# Patient Record
Sex: Female | Born: 1995 | Race: White | Hispanic: No | Marital: Single | Smoking: Never smoker
Health system: Southern US, Community
[De-identification: ages and names within clinical notes are randomized; demographics above are authoritative.]

## PROBLEM LIST (undated history)

## (undated) DIAGNOSIS — F209 Schizophrenia, unspecified: Secondary | ICD-10-CM

## (undated) DIAGNOSIS — F509 Eating disorder, unspecified: Secondary | ICD-10-CM

## (undated) DIAGNOSIS — F329 Major depressive disorder, single episode, unspecified: Secondary | ICD-10-CM

## (undated) DIAGNOSIS — E669 Obesity, unspecified: Secondary | ICD-10-CM

## (undated) DIAGNOSIS — F32A Depression, unspecified: Secondary | ICD-10-CM

## (undated) DIAGNOSIS — J069 Acute upper respiratory infection, unspecified: Secondary | ICD-10-CM

## (undated) DIAGNOSIS — H539 Unspecified visual disturbance: Secondary | ICD-10-CM

## (undated) DIAGNOSIS — L049 Acute lymphadenitis, unspecified: Secondary | ICD-10-CM

## (undated) DIAGNOSIS — Z789 Other specified health status: Secondary | ICD-10-CM

## (undated) DIAGNOSIS — F419 Anxiety disorder, unspecified: Secondary | ICD-10-CM

## (undated) HISTORY — DX: Acute lymphadenitis, unspecified: L04.9

## (undated) HISTORY — DX: Acute upper respiratory infection, unspecified: J06.9

---
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
## 2016-08-10 IMAGING — CT CT ABD-PELV W/ CM
2 of 4 series · 17 of 46 positions shown, 19 images · IV contrast (omnipaque)
Comparison: None.

CLINICAL DATA: Right lower quadrant pain, nausea and vomiting.
History of pancreatitis.

EXAM:
CT ABDOMEN AND PELVIS WITH CONTRAST
TECHNIQUE: Multidetector CT imaging of the abdomen and pelvis was performed
using the standard protocol following bolus administration of
intravenous contrast.
CONTRAST:  100mL OMNIPAQUE IOHEXOL 300 MG/ML  SOLN

[Series 2: routine abd pel with · axial · 0.81mm/px · z∈[-432,-22]mm · 14 of 90 slices shown, 16 images]
[im 4/90  soft-tissue]
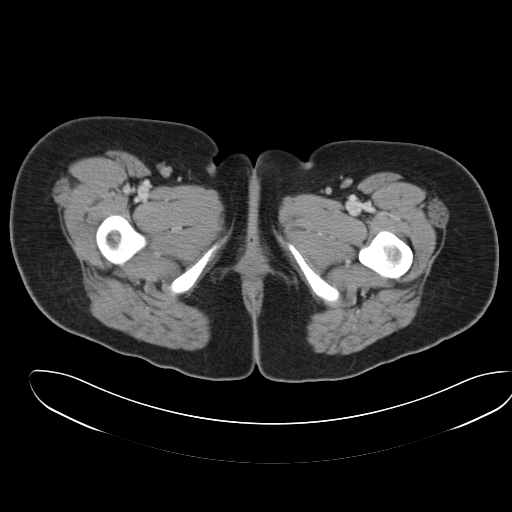
[im 4/90  bone]
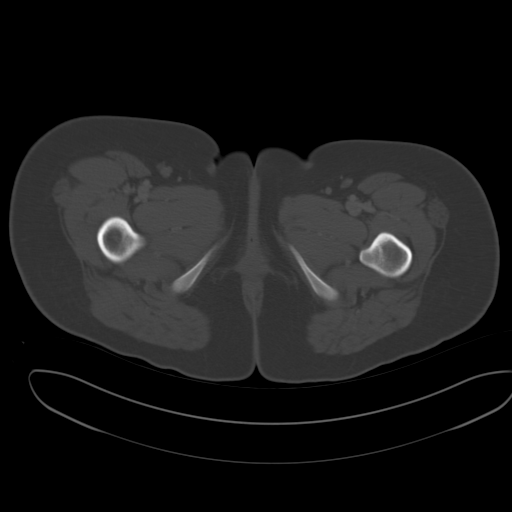
[im 11/90  soft-tissue]
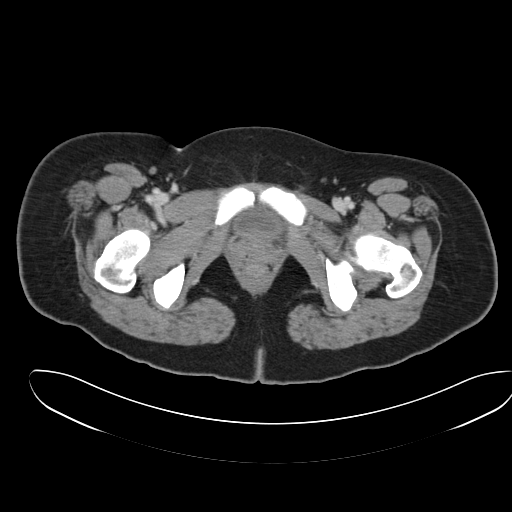
[im 18/90  soft-tissue]
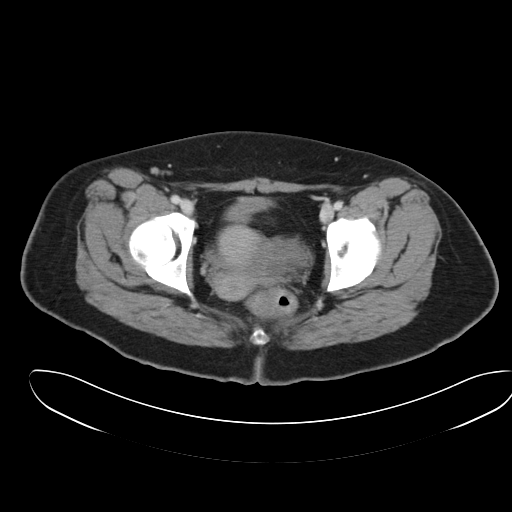
[im 24/90  soft-tissue]
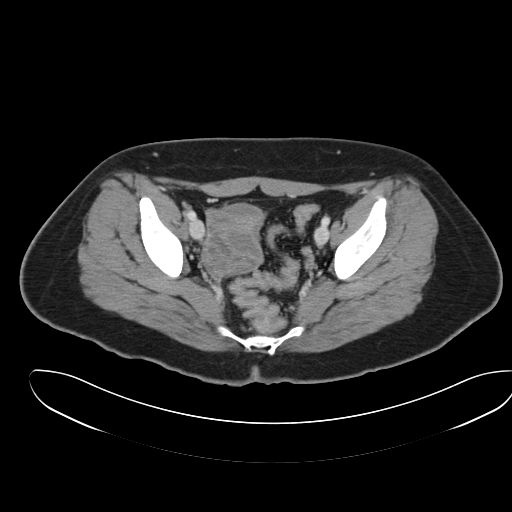
[im 31/90  soft-tissue]
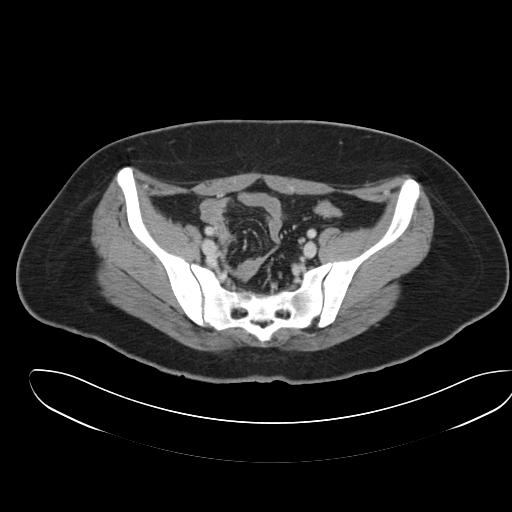
[im 35/90  soft-tissue]
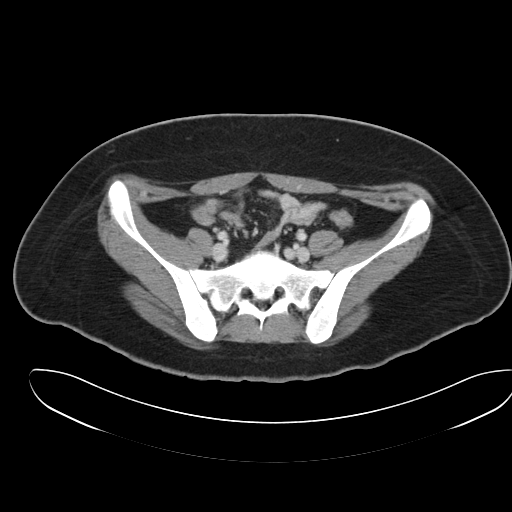
[im 42/90  soft-tissue]
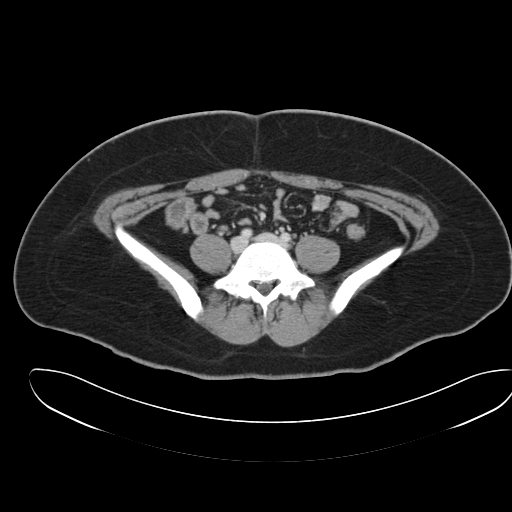
[im 48/90  soft-tissue]
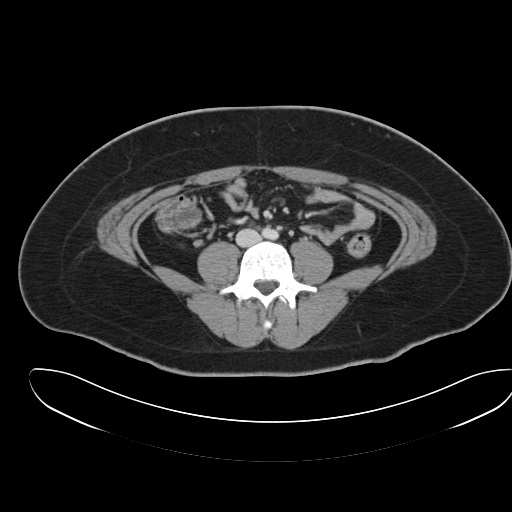
[im 55/90  soft-tissue]
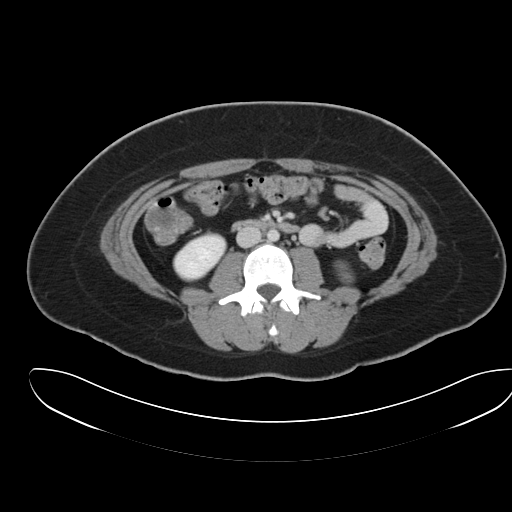
[im 55/90  bone]
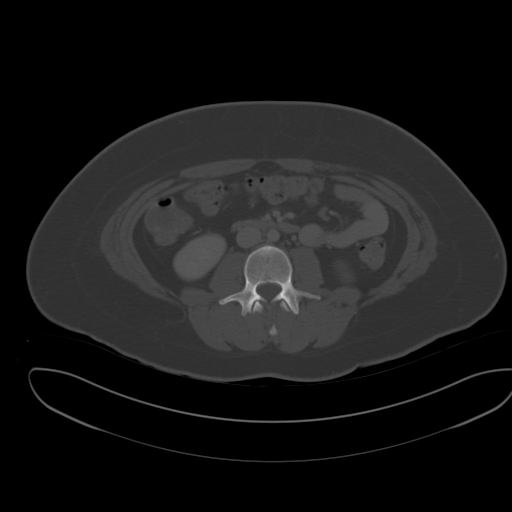
[im 59/90  soft-tissue]
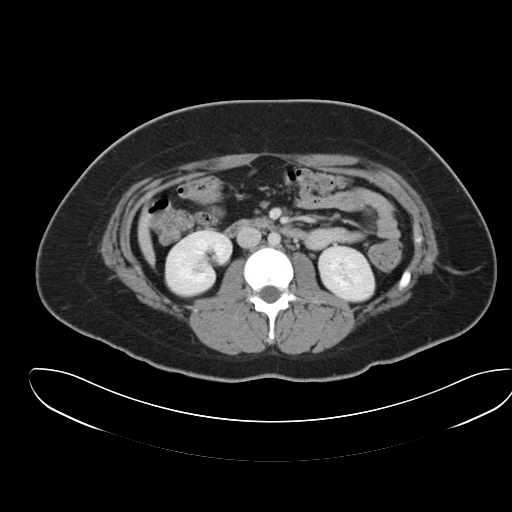
[im 66/90  soft-tissue]
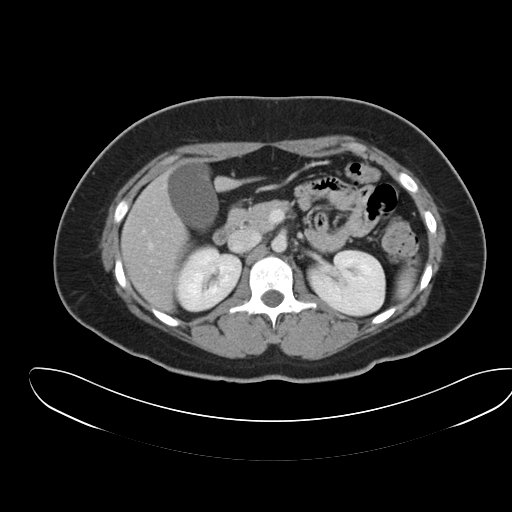
[im 72/90  soft-tissue]
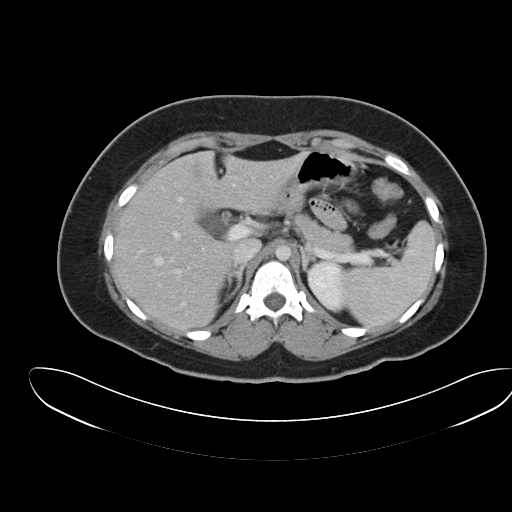
[im 79/90  soft-tissue]
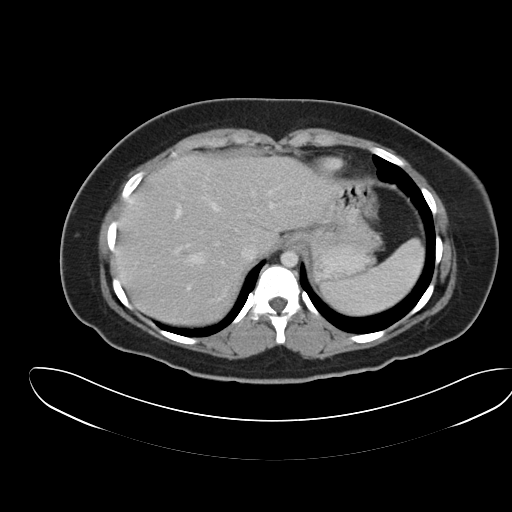
[im 86/90  soft-tissue]
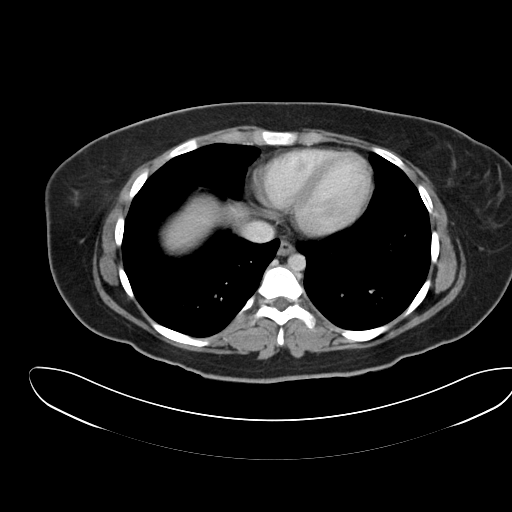

[Series 5: cor routine abd pel with · coronal · 0.92mm/px · 3 of 113 slices shown]
[im 38/113  soft-tissue]
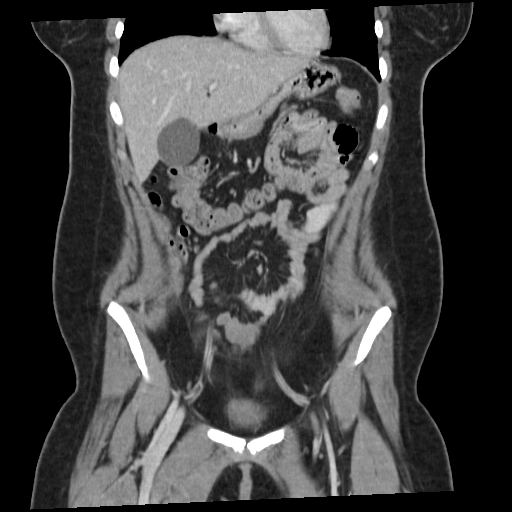
[im 50/113  soft-tissue]
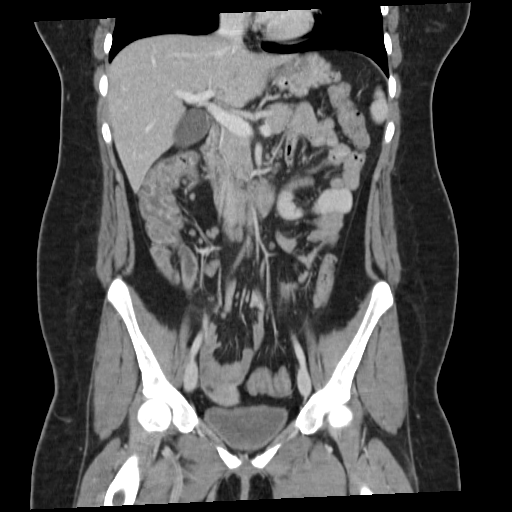
[im 63/113  soft-tissue]
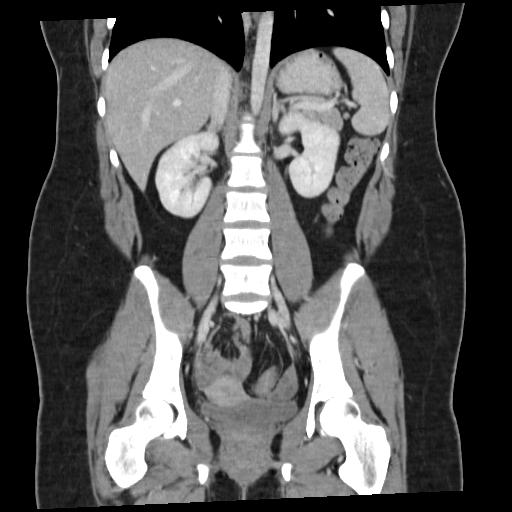

[17 of 46 positions shown; findings below may reference images not displayed]

FINDINGS: Focal fatty infiltration adjacent to the falciform ligament of the
liver. No other focal liver lesions. Gallbladder, spleen, pancreas,
adrenal glands, kidneys, abdominal aorta, inferior vena cava, and
retroperitoneal lymph nodes are unremarkable. Stomach and small
bowel are decompressed. Stool-filled colon without abnormal
distention. No free air or free fluid in the abdomen. Abdominal wall
musculature appears intact.

Pelvis: Scattered lymph nodes throughout the mesentery and right
lower quadrant are moderately prominent without pathologic
enlargement. These are likely reactive. Appendix is normal. Uterus
and ovaries are not enlarged. Bladder wall is not thickened. No free
or loculated pelvic fluid collections. No pelvic mass or
lymphadenopathy. No destructive bone lesions.
IMPRESSION: No acute process demonstrated in the abdomen or pelvis. Focal fatty
infiltration in the liver. Scattered mesenteric lymph nodes are
likely reactive.

## 2017-11-14 ENCOUNTER — Encounter: Payer: Self-pay | Admitting: Emergency Medicine

## 2017-11-14 ENCOUNTER — Emergency Department
Admission: EM | Admit: 2017-11-14 | Discharge: 2017-11-15 | Disposition: A | Discharge status: Home / Self Care | Payer: Medicare Other | Attending: Emergency Medicine | Admitting: Emergency Medicine

## 2017-11-14 ENCOUNTER — Other Ambulatory Visit: Payer: Self-pay

## 2017-11-14 DIAGNOSIS — Z046 Encounter for general psychiatric examination, requested by authority: Secondary | ICD-10-CM | POA: Diagnosis present

## 2017-11-14 DIAGNOSIS — F258 Other schizoaffective disorders: Secondary | ICD-10-CM | POA: Diagnosis not present

## 2017-11-14 DIAGNOSIS — F259 Schizoaffective disorder, unspecified: Secondary | ICD-10-CM

## 2017-11-14 NOTE — ED Notes (Signed)
Hourly rounding reveals patient in hall bed. No complaints, stable, in no acute distress. Q15 minute rounds and monitoring via Rover and Officer to continue.  

## 2017-11-14 NOTE — ED Notes (Signed)
Report to include Situation, Background, Assessment, and Recommendations received from Amy B. RN. Patient alert and oriented, warm and dry, in no acute distress. Patient denies HI, VH and pain. Pt. States she has "passive SI" and hears voices "talking in the background". Patient made aware of Q15 minute rounds and Psychologist, counselling presence for their safety. Patient instructed to come to me with needs or concerns.

## 2017-11-14 NOTE — ED Triage Notes (Signed)
Pt here from [place], states she needs her meds adjusted, has been under a lot of stress. States she isn't feeling the medication has been as effective, hearing more voices recently and seeing things at night, states she feels things are breathing on her at night. Has had an overdose in the past. NAD at this time.

## 2017-11-14 NOTE — ED Notes (Signed)
Pt. Talking to SOC Dr. 

## 2017-11-14 NOTE — ED Notes (Signed)
Patient dressed out by this EDT into purple hospital scrubs. Items in patient belongings bag include sandals, black t-shirt, pink bra, black necklace, black underwear, jeans, medications, wallet, and a pink bag in second belongings bag.

## 2017-11-14 NOTE — ED Provider Notes (Signed)
[facility] Emergency Department Provider Note   ____________________________________________   I have reviewed the triage vital signs and the nursing notes.   HISTORY  Chief Complaint Psychiatric Evaluation and Auditory and visual hallucinations   History limited by: Not cooperative   HPI Kari Lowery is a 22 y.o. female who presents to the emergency department today because of concern for increased auditory hallucinations. She states that they have been getting worse over the past few days. She does have a history of hallucinations. Thinks that her medications need adjusting. States she was recently living in [place] and does not currently have a psychiatrist in this area. Did not reach out to the psychiatrists she had last year when she had multiple admissions to [facility]. She denies any medical complaints.    Per medical record review patient has a history of bipolar, depression, SI.  Past Medical History:  Diagnosis Date  . Anxiety   . Depression   . Eating disorder   . Lymphadenitis, acute   . Medical history non-contributory   . Obesity   . URI, acute   . Vision abnormalities     Patient Active Problem List   Diagnosis Date Noted  . Acute pancreatitis 01/16/2015  . Post traumatic stress disorder 10/03/2013  . Cluster B personality disorder (HCC) 10/03/2013  . Oppositional defiant behavior 10/03/2013  . Suicide attempt by adequate means (HCC) 10/03/2013  . Bipolar I disorder, most recent episode depressed (HCC) 10/02/2013  . PTSD (post-traumatic stress disorder) 07/18/2013  . ODD (oppositional defiant disorder) 07/18/2013  . 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 Generalized abdominal pain 07/04/2013  . Diarrhea   . Vomiting     History reviewed. No pertinent surgical history.  Prior to Admission medications   Medication Sig Start Date End Date Taking? Authorizing Provider   amoxicillin-clavulanate (AUGMENTIN) 875-125 MG per tablet Take 1 tablet by mouth every 12 (twelve) hours. 01/17/15   Alford Highland, MD  omeprazole (PRILOSEC) 20 MG capsule Take 1 capsule (20 mg total) by mouth daily. Patient may resume home supply. 01/17/15   Alford Highland, MD    Allergies Patient has no known allergies.  Family History  Problem Relation Age of Onset  . Bipolar disorder Mother   . Kidney disease Mother   . Cholelithiasis Paternal Grandmother   . Celiac disease Neg Hx     Social History Social History   Tobacco Use  . Smoking status: Never Smoker  . Smokeless tobacco: Never Used  Substance Use Topics  . Alcohol use: No  . Drug use: No    Review of Systems Constitutional: No fever/chills Eyes: No visual changes. ENT: No sore throat. Cardiovascular: Denies chest pain. Respiratory: Denies shortness of breath. Gastrointestinal: No abdominal pain.  No nausea, no vomiting.  No diarrhea.   Genitourinary: Negative for dysuria. Musculoskeletal: Negative for back pain. Skin: Negative for rash. Neurological: Negative for headaches, focal weakness or numbness.  ____________________________________________   PHYSICAL EXAM:  VITAL SIGNS: ED Triage Vitals  Enc Vitals Group     BP 11/14/17 1556 (!) 108/56     Pulse Rate 11/14/17 1556 92     Resp 11/14/17 1556 19     Temp 11/14/17 1556 98.5 F (36.9 C)     Temp Source 11/14/17 1556 Oral     SpO2 11/14/17 1556 99 %     Weight 11/14/17 1539 200 lb (90.7 kg)     Height 11/14/17 1539  (1.626  m)   Constitutional: Alert and oriented. Well appearing and in no distress. Eyes: Conjunctivae are normal.  ENT   Head: Normocephalic and atraumatic.   Nose: No congestion/rhinnorhea.   Mouth/Throat: Mucous membranes are moist.   Neck: No stridor. Hematological/Lymphatic/Immunilogical: No cervical lymphadenopathy. Cardiovascular: Normal rate, regular rhythm.  No murmurs, rubs, or  gallops. Respiratory: Normal respiratory effort without tachypnea nor retractions. Breath sounds are clear and equal bilaterally. No wheezes/rales/rhonchi. Gastrointestinal: Soft and non tender. No rebound. No guarding.  Genitourinary: Deferred Musculoskeletal: Normal range of motion in all extremities. No lower extremity edema. Neurologic:  Normal speech and language. No gross focal neurologic deficits are appreciated.  Skin:  Skin is warm, dry and intact. No rash noted. Psychiatric: Does not appear to be responding to internal stimuli. Does not endorse SI.  ____________________________________________    LABS (pertinent positives/negatives)  Ethan, acet, sali below threshold CMP wnl CBC wbc 12.9, hgb 14.7, plt 367  ____________________________________________   EKG  None  ____________________________________________    RADIOLOGY  None  ____________________________________________   PROCEDURES  Procedures  ____________________________________________   INITIAL IMPRESSION / ASSESSMENT AND PLAN / ED COURSE  Pertinent labs & imaging results that were available during my care of the patient were reviewed by me and considered in my medical decision making (see chart for details).  Patient presented to the emergency department today because of concerns for increased hallucinations.  Patient denies any SI.  Is not obviously responding to any internal stimuli.  Will have psychiatry evaluate.  ____________________________________________   FINAL CLINICAL IMPRESSION(S) / ED DIAGNOSES  Final diagnoses:  Schizoaffective disorder, unspecified type (HCC)     Note: This dictation was prepared with Dragon dictation. Any transcriptional errors that result from this process are unintentional     Phineas Semen, MD 11/15/17 1700

## 2017-11-14 NOTE — ED Notes (Signed)
reintitiated consult for [facility]   2252

## 2017-11-14 NOTE — ED Notes (Signed)
Hourly rounding reveals patient sleeping in hall bed. No complaints, stable, in no acute distress. Q15 minute rounds and monitoring via Rover and Officer to continue.  

## 2017-11-15 LAB — COMPREHENSIVE METABOLIC PANEL
ALT: 27 U/L (ref 14–54)
AST: 24 U/L (ref 15–41)
Albumin: 4.5 g/dL (ref 3.5–5.0)
Alkaline Phosphatase: 92 U/L (ref 38–126)
Anion gap: 9 (ref 5–15)
BUN: 10 mg/dL (ref 6–20)
CHLORIDE: 104 mmol/L (ref 101–111)
CO2: 24 mmol/L (ref 22–32)
CREATININE: 0.62 mg/dL (ref 0.44–1.00)
Calcium: 9.6 mg/dL (ref 8.9–10.3)
GFR calc Af Amer: >60 mL/min (ref >60)
GLUCOSE: 91 mg/dL (ref 65–99)
Potassium: 3.7 mmol/L (ref 3.5–5.1)
Sodium: 137 mmol/L (ref 135–145)
Total Bilirubin: 0.5 mg/dL (ref 0.3–1.2)
Total Protein: 8 g/dL (ref 6.5–8.1)

## 2017-11-15 LAB — CBC WITH DIFFERENTIAL/PLATELET
Basophils Absolute: 0 10*3/uL (ref 0–0.1)
Basophils Relative: 0 %
Eosinophils Absolute: 0.4 10*3/uL (ref 0–0.7)
Eosinophils Relative: 3 %
HCT: 42.2 % (ref 35.0–47.0)
Hemoglobin: 14.7 g/dL (ref 12.0–16.0)
LYMPHS ABS: 3.2 10*3/uL (ref 1.0–3.6)
LYMPHS PCT: 25 %
MCH: 29.1 pg (ref 26.0–34.0)
MCHC: 34.7 g/dL (ref 32.0–36.0)
MCV: 83.9 fL (ref 80.0–100.0)
Monocytes Absolute: 1.1 10*3/uL — ABNORMAL HIGH (ref 0.2–0.9)
Monocytes Relative: 9 %
Neutro Abs: 8.2 10*3/uL — ABNORMAL HIGH (ref 1.4–6.5)
Neutrophils Relative %: 63 %
PLATELETS: 367 10*3/uL (ref 150–440)
RBC: 5.03 MIL/uL (ref 3.80–5.20)
RDW: 13.3 % (ref 11.5–14.5)
WBC: 12.9 10*3/uL — ABNORMAL HIGH (ref 3.6–11.0)

## 2017-11-15 LAB — ACETAMINOPHEN LEVEL: Acetaminophen (Tylenol), Serum: <10 ug/mL — ABNORMAL LOW (ref 10–30)

## 2017-11-15 LAB — SALICYLATE LEVEL: Salicylate Lvl: <7 mg/dL (ref 2.8–30.0)

## 2017-11-15 LAB — ETHANOL: Alcohol, Ethyl (B): <10 mg/dL (ref <10)

## 2017-11-15 MED ORDER — HYDROXYZINE HCL 25 MG PO TABS: 25.0000 mg | ORAL_TABLET | Freq: Three times a day (TID) | ORAL | 0 refills | Status: DC

## 2017-11-15 MED ORDER — TRAZODONE HCL 50 MG PO TABS: 50.0000 mg | ORAL_TABLET | Freq: Every day | ORAL | 0 refills | Status: DC

## 2017-11-15 MED ORDER — ARIPIPRAZOLE 20 MG PO TABS: 20.0000 mg | ORAL_TABLET | Freq: Every day | ORAL | 0 refills | Status: DC

## 2017-11-15 NOTE — Discharge Instructions (Signed)
Take the following medicines daily: Abilify  every morning Vistaril  three times daily Trazodone  at bedtime 2. Return to the ER for worsening symptoms, feelings of hurting yourself or others, or other concerns.

## 2017-11-15 NOTE — ED Notes (Signed)
Hourly rounding reveals patient in hall bed. No complaints, stable, in no acute distress. Q15 minute rounds and monitoring via Rover and Officer to continue.  

## 2017-11-15 NOTE — ED Notes (Signed)
Hourly rounding reveals patient sitting on hall bed. No complaints, stable, in no acute distress. Q15 minute rounds and monitoring via Rover and Officer to continue.  

## 2017-11-15 NOTE — ED Notes (Signed)
Hourly rounding reveals patient sleeping in hall bed. No complaints, stable, in no acute distress. Q15 minute rounds and monitoring via Rover and Officer to continue.  

## 2018-01-12 ENCOUNTER — Emergency Department (HOSPITAL_COMMUNITY)
Admission: EM | Admit: 2018-01-12 | Discharge: 2018-01-13 | Disposition: A | Discharge status: Home / Self Care | Payer: Self-pay | Attending: Emergency Medicine | Admitting: Emergency Medicine

## 2018-01-12 ENCOUNTER — Encounter (HOSPITAL_COMMUNITY): Payer: Self-pay | Admitting: Emergency Medicine

## 2018-01-12 DIAGNOSIS — R443 Hallucinations, unspecified: Secondary | ICD-10-CM

## 2018-01-12 DIAGNOSIS — F209 Schizophrenia, unspecified: Secondary | ICD-10-CM | POA: Insufficient documentation

## 2018-01-12 DIAGNOSIS — R440 Auditory hallucinations: Secondary | ICD-10-CM | POA: Insufficient documentation

## 2018-01-12 DIAGNOSIS — F913 Oppositional defiant disorder: Secondary | ICD-10-CM | POA: Insufficient documentation

## 2018-01-12 DIAGNOSIS — F419 Anxiety disorder, unspecified: Secondary | ICD-10-CM | POA: Insufficient documentation

## 2018-01-12 HISTORY — DX: Schizophrenia, unspecified: F20.9

## 2018-01-12 LAB — CBC WITH DIFFERENTIAL/PLATELET
Abs Immature Granulocytes: 0.1 10*3/uL (ref 0.0–0.1)
BASOS ABS: 0 10*3/uL (ref 0.0–0.1)
Basophils Relative: 0 %
EOS ABS: 0.3 10*3/uL (ref 0.0–0.7)
EOS PCT: 3 %
HCT: 41.1 % (ref 36.0–46.0)
Hemoglobin: 13.6 g/dL (ref 12.0–15.0)
IMMATURE GRANULOCYTES: 1 %
LYMPHS PCT: 23 %
Lymphs Abs: 2.3 10*3/uL (ref 0.7–4.0)
MCH: 27.8 pg (ref 26.0–34.0)
MCHC: 33.1 g/dL (ref 30.0–36.0)
MCV: 84 fL (ref 78.0–100.0)
Monocytes Absolute: 0.8 10*3/uL (ref 0.1–1.0)
Monocytes Relative: 8 %
NEUTROS PCT: 65 %
Neutro Abs: 6.8 10*3/uL (ref 1.7–7.7)
Platelets: 355 10*3/uL (ref 150–400)
RBC: 4.89 MIL/uL (ref 3.87–5.11)
RDW: 12.6 % (ref 11.5–15.5)
WBC: 10.4 10*3/uL (ref 4.0–10.5)

## 2018-01-12 LAB — COMPREHENSIVE METABOLIC PANEL
ALBUMIN: 4 g/dL (ref 3.5–5.0)
ALT: 38 U/L (ref 0–44)
AST: 28 U/L (ref 15–41)
Alkaline Phosphatase: 91 U/L (ref 38–126)
Anion gap: 10 (ref 5–15)
BILIRUBIN TOTAL: 0.4 mg/dL (ref 0.3–1.2)
BUN: 7 mg/dL (ref 6–20)
CO2: 20 mmol/L — ABNORMAL LOW (ref 22–32)
CREATININE: 0.77 mg/dL (ref 0.44–1.00)
Calcium: 9.2 mg/dL (ref 8.9–10.3)
Chloride: 108 mmol/L (ref 98–111)
GFR calc Af Amer: >60 mL/min (ref >60)
GLUCOSE: 99 mg/dL (ref 70–99)
Potassium: 3.5 mmol/L (ref 3.5–5.1)
SODIUM: 138 mmol/L (ref 135–145)
TOTAL PROTEIN: 6.8 g/dL (ref 6.5–8.1)

## 2018-01-12 LAB — RAPID URINE DRUG SCREEN, HOSP PERFORMED
AMPHETAMINES: NOT DETECTED
Benzodiazepines: NOT DETECTED
Cocaine: NOT DETECTED
Opiates: NOT DETECTED
Tetrahydrocannabinol: NOT DETECTED

## 2018-01-12 LAB — ACETAMINOPHEN LEVEL

## 2018-01-12 LAB — SALICYLATE LEVEL: Salicylate Lvl: <7 mg/dL (ref 2.8–30.0)

## 2018-01-12 LAB — ETHANOL

## 2018-01-12 LAB — I-STAT BETA HCG BLOOD, ED (MC, WL, AP ONLY)

## 2018-01-12 MED ORDER — ACETAMINOPHEN 325 MG PO TABS: 650.0000 mg | ORAL_TABLET | Freq: Four times a day (QID) | ORAL | Status: DC | PRN

## 2018-01-12 MED ORDER — LORAZEPAM 1 MG PO TABS
1.0000 mg | ORAL_TABLET | ORAL | Status: DC | PRN
Start: 2018-01-12 — End: 2018-01-13

## 2018-01-12 MED ORDER — HYDROXYZINE HCL 25 MG PO TABS
25.0000 mg | ORAL_TABLET | Freq: Three times a day (TID) | ORAL | Status: DC
  Administered 2018-01-12 – 2018-01-13 (×2): 25 mg via ORAL
  Filled 2018-01-12 (×2): qty 1

## 2018-01-12 MED ORDER — ARIPIPRAZOLE 10 MG PO TABS
20.0000 mg | ORAL_TABLET | Freq: Every day | ORAL | Status: DC
  Administered 2018-01-13: 20 mg via ORAL
  Filled 2018-01-12: qty 2

## 2018-01-12 MED ORDER — TRAZODONE HCL 50 MG PO TABS
50.0000 mg | ORAL_TABLET | Freq: Every day | ORAL | Status: DC
  Administered 2018-01-12: 50 mg via ORAL
  Filled 2018-01-12: qty 1

## 2018-01-12 NOTE — ED Notes (Signed)
Kari Lowery [phone] Pt aunt

## 2018-01-12 NOTE — ED Notes (Signed)
Belongings in locker 4  

## 2018-01-12 NOTE — ED Triage Notes (Signed)
Patient presents to ED for increasing depressed thoughts.  Patient initially voiced no thoughts of hurting herself or others, but when asked if she wished she were dead, she states yes.  Patient states hx of depression, anxiety and schizophrenia and that her aunt encouraged her to come here due to increasing worsening of symptoms.  Patient states she occasionally has visual hallucinations, but has auditory ones most often.  They are "never" command, she states, but also very negative towards her.  Patient states she has not been taking her medications for the past few months due to not having a support system to help her purchase and organize them (states her mom up [place] used to put them in pill boxes for her).  Patient denies drugs or alcohol use.  Patient calm, and cooperative in triage.

## 2018-01-12 NOTE — BH Assessment (Signed)
Unable to complete TTS consult at this time, pt is in the hallway and will be moving to Pod F in about an hour per [place]

## 2018-01-12 NOTE — ED Provider Notes (Addendum)
Patient placed in Quick Look pathway, seen and evaluated   Chief Complaint: Mental health eval  HPI:   22 year old with depression and schizophrenia presents for mental health eval. [place] [facility] however her family brought her here because they didn't like the hospital there. She's been off her meds for 3 months. She doesn't know why she stopped. She has auditory and visual hallucinations but these are all the time. She states that she was in an argument with her family today and therefore she was brought here. No physical complaints. No SI/HI  ROS: +depression, AVH  Physical Exam:   Gen: No distress  Neuro: Awake and Alert  Skin: Warm    Focused Exam: Psych: Calm, cooperative, lucid   Initiation of care has begun. The patient has been counseled on the process, plan, and necessity for staying for the completion/evaluation, and the remainder of the medical screening examination     Bethel BornGekas, Artina Minella Marie, PA-C 01/12/18 1806    Shaune PollackIsaacs, Cameron, MD 01/14/18 1105

## 2018-01-12 NOTE — ED Provider Notes (Signed)
[facility] EMERGENCY DEPARTMENT Provider Note   CSN: [id number] Arrival date & time: 01/12/18  1724     History   Chief Complaint Chief Complaint  Patient presents with  . Depression    HPI Kari Lowery is a 22 y.o. female hx of depression, schizophrenia, here presenting with depression, hallucinations.  Patient states that she decided not to take her medicines several months ago.  Patient was reported to be more aggressive at home per the aunt.  Patient does admits to hearing voices and seeing things.  Patient states that the voices are there but not telling her to do anything.  She states that she is depressed and had previous suicidal attempts but no suicidal ideations currently.  Patient denies any homicidal ideations.  Patient had multiple psychiatric admissions in the past.  The history is provided by the patient.    Past Medical History:  Diagnosis Date  . Anxiety   . Depression   . Eating disorder   . Lymphadenitis, acute   . Medical history non-contributory   . Obesity   . Schizophrenia (HCC)   . URI, acute   . Vision abnormalities     Patient Active Problem List   Diagnosis Date Noted  . Acute pancreatitis 01/16/2015  . Post traumatic stress disorder 10/03/2013  . Cluster B personality disorder (HCC) 10/03/2013  . Oppositional defiant behavior 10/03/2013  . Suicide attempt by adequate means (HCC) 10/03/2013  . Bipolar I disorder, most recent episode depressed (HCC) 10/02/2013  . PTSD (post-traumatic stress disorder) 07/18/2013  . ODD (oppositional defiant disorder) 07/18/2013  . 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 Generalized abdominal pain 07/04/2013  . Diarrhea   . Vomiting     History reviewed. No pertinent surgical history.   OB History   None      Home Medications    Prior to Admission medications   Medication Sig Start Date End Date Taking? Authorizing Provider   ARIPiprazole (ABILIFY) 20 MG tablet Take 1 tablet (20 mg total) by mouth daily. Patient not taking: Reported on 01/12/2018 11/15/17   Irean Hong, MD  hydrOXYzine (ATARAX/VISTARIL) 25 MG tablet Take 1 tablet (25 mg total) by mouth 3 (three) times daily. Patient not taking: Reported on 01/12/2018 11/15/17   Irean Hong, MD  omeprazole (PRILOSEC) 20 MG capsule Take 1 capsule (20 mg total) by mouth daily. Patient may resume home supply. Patient not taking: Reported on 11/14/2017 01/17/15   Alford Highland, MD  traZODone (DESYREL) 50 MG tablet Take 1 tablet (50 mg total) by mouth at bedtime. Patient not taking: Reported on 01/12/2018 11/15/17   Irean Hong, MD    Family History Family History  Problem Relation Age of Onset  . Bipolar disorder Mother   . Kidney disease Mother   . Cholelithiasis Paternal Grandmother   . Celiac disease Neg Hx     Social History Social History   Tobacco Use  . Smoking status: Never Smoker  . Smokeless tobacco: Never Used  Substance Use Topics  . Alcohol use: No  . Drug use: No     Allergies   Patient has no known allergies.   Review of Systems Review of Systems  Psychiatric/Behavioral: Positive for behavioral problems and hallucinations.  All other systems reviewed and are negative.    Physical Exam Updated Vital Signs BP (!) 108/58 (BP Location: Right Arm)   Pulse 79   Temp 98.3 F (36.8 C) (Oral)  Resp 18   SpO2 100%   Physical Exam  Constitutional: She is oriented to person, place, and time. She appears well-developed and well-nourished.  HENT:  Head: Normocephalic.  Mouth/Throat: Oropharynx is clear and moist.  Eyes: Pupils are equal, round, and reactive to light. Conjunctivae and EOM are normal.  Neck: Normal range of motion. Neck supple.  Cardiovascular: Normal rate, regular rhythm and normal heart sounds.  Pulmonary/Chest: Effort normal and breath sounds normal. No stridor. No respiratory distress. She has no wheezes.    Abdominal: Soft. Bowel sounds are normal. She exhibits no distension. There is no tenderness.  Musculoskeletal: Normal range of motion.  Neurological: She is alert and oriented to person, place, and time.  Skin: Skin is warm.  Psychiatric:  Depressed   Nursing note and vitals reviewed.    ED Treatments / Results  Labs (all labs ordered are listed, but only abnormal results are displayed) Labs Reviewed  COMPREHENSIVE METABOLIC PANEL - Abnormal; Notable for the following components:      Result Value   CO2 20 (*)    All other components within normal limits  ACETAMINOPHEN LEVEL - Abnormal; Notable for the following components:   Acetaminophen (Tylenol), Serum <10 (*)    All other components within normal limits  ETHANOL  CBC WITH DIFFERENTIAL/PLATELET  SALICYLATE LEVEL  RAPID URINE DRUG SCREEN, HOSP PERFORMED  I-STAT BETA HCG BLOOD, ED ([facility], [facility], [facility] ONLY)    EKG None  Radiology No results found.  Procedures Procedures (including critical care time)  Medications Ordered in ED Medications - No data to display   Initial Impression / Assessment and Plan / ED Course  I have reviewed the triage vital signs and the nursing notes.  Pertinent labs & imaging results that were available during my care of the patient were reviewed by me and considered in my medical decision making (see chart for details).    Kari Lowery is a 22 y.o. female hx of schizophrenia here with hallucinations, aggressive behavior. She is not taking her meds. Labs unremarkable. Medically cleared for psych eval.    Final Clinical Impressions(s) / ED Diagnoses   Final diagnoses:  None    ED Discharge Orders    None       Charlynne PanderYao, David Hsienta, MD 01/12/18 2235

## 2018-01-12 NOTE — ED Notes (Signed)
No answer x 3  2 triage people and  Nurse first unable to locate the pt

## 2018-01-12 NOTE — ED Notes (Signed)
Per pt aunt who the pt lives with, pt has been off her medication for the past several months and becoming more aggressive at home, aunt reports that she hears and sees voices. Aunt would like to be called if possible to give more insight on pt behavior.

## 2018-01-13 ENCOUNTER — Encounter (HOSPITAL_COMMUNITY): Payer: Self-pay | Admitting: Registered Nurse

## 2018-01-13 MED ORDER — ARIPIPRAZOLE 5 MG PO TABS
5.0000 mg | ORAL_TABLET | Freq: Every day | ORAL | Status: DC
  Administered 2018-01-13: 5 mg via ORAL
  Filled 2018-01-13: qty 1

## 2018-01-13 MED ORDER — ARIPIPRAZOLE ER 400 MG IM SRER
400.0000 mg | INTRAMUSCULAR | Status: DC
  Administered 2018-01-13: 400 mg via INTRAMUSCULAR
  Filled 2018-01-13: qty 2

## 2018-01-13 MED ORDER — ARIPIPRAZOLE 5 MG PO TABS: 5.0000 mg | ORAL_TABLET | Freq: Every day | ORAL | 0 refills | Status: DC

## 2018-01-13 NOTE — ED Notes (Signed)
[facility] and walked out with Grandmother.

## 2018-01-13 NOTE — ED Notes (Signed)
Site at injection at right buttock without. Reddness swelling or drainage and pt denies pain at site.

## 2018-01-13 NOTE — ED Notes (Addendum)
DC intructionsdiscussed with pt and family. Medication regimen confirmed with pharmacy and information about medication and regimen given to pt. Pt home stable with grandmother and denies complaint. Pt understands follow up through [facility] and return for any complaint or problem.

## 2018-01-13 NOTE — ED Notes (Signed)
All belonging were returned

## 2018-01-13 NOTE — ED Notes (Signed)
Regular/no sharps ordered 

## 2018-01-13 NOTE — Discharge Instructions (Addendum)
Please return for any problem. Follow up with your regular physician as instructed.  °

## 2018-01-13 NOTE — Consult Note (Signed)
  Tele Assessment   Kari Lowery, 22 y.o., female [facility] complaints of depression and auditory hallucination; and that she had stopped taking her medication 2-3 months ago.  Patient seen via telepsych by this provider; chart reviewed and consulted with Dr. Lucianne MussKumar on 01/13/18.  On evaluation Kari Lowery reports she was feeling upset and her aunt thought it would be a good idea if she came to the hospital.  Patient states that she was diagnosed with schizophrenia when she was 22 yr old.  States that she has auditory hallucinations on a daily basis.  "They never go away.  The medicine helps some but the voices never stop.  Sometimes they are really bad; and sometimes I can handle them better.  They just say stuff like don't open the door; or don't do this or don't do that; sometimes they laugh at me and make me feel bad for myself.  Right now they are just annoying; but I can handle it."  Patient states that she would prefer to have monthly injection instead of pill because it's easier for her.  States that she has taken Abilify Maintena in past and worked for her.  Patient denies suicidal/self-harm/homicidal ideation and psychosis.  Patient states that she lives with her aunt and was going to [facility] for outpatient psychiatric services but unsure of when last visit was but states she never got her prescription filled.     During evaluation Kari Lowery is alert/oriented x 4; calm/cooperative; and mood congruent with affect.  She does not appear to be responding to internal/external stimuli or delusional thoughts; but reports that she is having auditory hallucinations that are currently annoying.  Patient denied suicidal/self-harm/homicidal ideation, psychosis, and paranoia.  Patient answered question appropriately.  Recommendations:  Give Abilify Maintena 400 mg IM; patient will need to follow up with [facility] in one month for next injection.  Patient will need  prescription for Abilify 5 mg daily PO for 14 days  Disposition:  Patient psychiatrically cleared No evidence of imminent risk to self or others at present.   Patient does not meet criteria for psychiatric inpatient admission.   Spoke with Dr.Messick; informed of above recommendations and disposition  Assunta FoundShuvon Rankin, NP

## 2018-01-13 NOTE — ED Notes (Signed)
Pt received lunch tray 

## 2018-01-13 NOTE — BH Assessment (Addendum)
Tele Assessment Note   Patient Name: Kari Lowery MRN: [id number] Referring Physician: Chaney Malling, MD Location of [facility]: [facility] Location of Provider: Behavioral Health TTS Department  Kari Lowery is an 22 y.o. female who presents voluntarily to [facility] reporting symptoms of depression and auditory/visual hallucinations. Pt has a history of schizophrenia. Pt reports she stopped taking her medications 2 or 3 months ago.  Pt denies current suicidal ideation and denies having a plan. Pt denies past attempts. Pt acknowledges symptoms including: sadness, fatigue, guilt, low self esteem, tearfulness, isolating, lack of motivation, anger, irritability, negative outlook, difficulty concentrating, helplessness, hopelessness, sleeping more, excessive worry and restlessness.  Pt denies homicidal ideation/ history of violence. Pt reports auditory or visual hallucinations or other psychotic symptoms. Pt states current stressors include general family stress.   Pt lives with her aunt and grandmother and supports include her aunt. Pt denies history of abuse and trauma. Pt reports there is a family history of MH/SA. Pt is currently unemployed. Pt has fair insight and partial judgment. Pt's memory is intact.  Pt denies legal history.  Pt's OP history includes receiving medication management at [facility] in [place], [place]. IP history includes an admission to [facility] in September 2018.  Pt denies alcohol/ substance abuse.  Pt is dressed in scrubs, alert, oriented x4 with normal speech and normal motor behavior. Eye contact is good. Pt's mood is depressed and affect is congruent with mood. Thought process is coherent and relevant. There is no indication pt is currently responding to internal stimuli or experiencing delusional thought content. Pt was cooperative throughout assessment. Pt is currently able to contract for safety outside the hospital.   Spoke to pt's aunt Kari Lowery for collateral  information. Per aunt: she came to live with me about 2 or 3 months ago, shortly after moving in she stopped taking her medications.  We noticed a change because she was less social, losing interest in things she enjoys and becoming more agitated and hostile. She has schizophrenia and needs to be on her medications, we talked about all of this today because I want her to get back to herself.  She hears voices even when she is on the medications but they are happier and positive, now the voices are saying mean and hurtful things to her.  She has hallucinated in the past and I don't want things to get to that point."  Pt's aunt also reports the pt attempted suicide a few months ago while she was living in [place] by overdosing on pills.  Diagnosis: F20.9 Schizophrenia  Past Medical History:  Past Medical History:  Diagnosis Date  . Anxiety   . Depression   . Eating disorder   . Lymphadenitis, acute   . Medical history non-contributory   . Obesity   . Schizophrenia (HCC)   . URI, acute   . Vision abnormalities     History reviewed. No pertinent surgical history.  Family History:  Family History  Problem Relation Age of Onset  . Bipolar disorder Mother   . Kidney disease Mother   . Cholelithiasis Paternal Grandmother   . Celiac disease Neg Hx     Social History:  reports that she has never smoked. She has never used smokeless tobacco. She reports that she does not drink alcohol or use drugs.  Additional Social History:  Alcohol / Drug Use Pain Medications: See MAR Prescriptions: See MAR Over the Counter: See MAR History of alcohol / drug use?: No history of alcohol /  drug abuse Longest period of sobriety (when/how long): None   CIWA: CIWA-Ar BP: (!) 108/58 Pulse Rate: 79 COWS:    Allergies: No Known Allergies  Home Medications:  (Not in a hospital admission)  OB/GYN Status:  No LMP recorded. Patient has had an implant.  General Assessment Data Assessment unable to be  completed: Yes Reason for not completing assessment: Pt is in the hallway, should move to Pod F in an hour [facility] Assessment: In system Is this a Tele or Face-to-Face Assessment?: Tele Assessment Is this an Initial Assessment or a Re-assessment for this encounter?: Initial Assessment Marital status: Single Maiden name: NA Is patient pregnant?: No Pregnancy Status: No Living Arrangements: Other relatives(Pt's aunt and grandmother) Can pt return to current living arrangement?: Yes Admission Status: Voluntary Is patient capable of signing voluntary admission?: Yes Referral Source: Self/Family/Friend Insurance type: Out of State Medicaid     Crisis Care Plan Living Arrangements: Other relatives(Pt's aunt and grandmother) Name of Psychiatrist: [facility] Name of Therapist: None  Education Status Is patient currently in school?: No Is the patient employed, unemployed or receiving disability?: Unemployed  Risk to self with the past 6 months Suicidal Ideation: No Has patient been a risk to self within the past 6 months prior to admission? : Yes Suicidal Intent: No Has patient had any suicidal intent within the past 6 months prior to admission? : Yes Is patient at risk for suicide?: No Suicidal Plan?: No Has patient had any suicidal plan within the past 6 months prior to admission? : Yes Access to Means: No What has been your use of drugs/alcohol within the last 12 months?: Pt denies Previous Attempts/Gestures: Yes How many times?: 1 Other Self Harm Risks: Pt denies Triggers for Past Attempts: Unpredictable, Unknown Intentional Self Injurious Behavior: Cutting Comment - Self Injurious Behavior: Pt reports she hasn't cut herself in years Family Suicide History: No Recent stressful life event(s): Turmoil (Comment)(Pt states family stuff but nother major) Persecutory voices/beliefs?: Yes Depression: Yes Depression Symptoms: Despondent, Insomnia,  Tearfulness, Isolating, Fatigue, Guilt, Loss of interest in usual pleasures, Feeling worthless/self pity, Feeling angry/irritable Substance abuse history and/or treatment for substance abuse?: No Suicide prevention information given to non-admitted patients: Not applicable  Risk to Others within the past 6 months Homicidal Ideation: No Does patient have any lifetime risk of violence toward others beyond the six months prior to admission? : No Thoughts of Harm to Others: No Current Homicidal Intent: No Current Homicidal Plan: No Access to Homicidal Means: No Identified Victim: Pt denies History of harm to others?: No Assessment of Violence: None Noted Violent Behavior Description: Pt denies Does patient have access to weapons?: No Criminal Charges Pending?: No Does patient have a court date: No Is patient on probation?: No  Psychosis Hallucinations: Auditory, Visual, With command Delusions: None noted  Mental Status Report Appearance/Hygiene: In scrubs Eye Contact: Good Motor Activity: Freedom of movement Speech: Logical/coherent, Soft Level of Consciousness: Quiet/awake Mood: Depressed Affect: Depressed Anxiety Level: None Thought Processes: Coherent, Relevant Judgement: Partial Orientation: Person, Place, Time, Situation, Appropriate for developmental age Obsessive Compulsive Thoughts/Behaviors: None  Cognitive Functioning Concentration: Normal Memory: Recent Intact, Remote Intact Is patient IDD: No Is patient DD?: No Insight: Fair Impulse Control: Fair Appetite: Good Have you had any weight changes? : No Change Sleep: Decreased Total Hours of Sleep: 9 Vegetative Symptoms: Staying in bed, Not bathing, Decreased grooming  ADLScreening [facility] Patient's cognitive ability adequate to safely complete daily activities?:  Yes Patient able to express need for assistance with ADLs?: Yes Independently performs ADLs?: Yes (appropriate for developmental  age)  Prior Inpatient Therapy Prior Inpatient Therapy: Yes Prior Therapy Dates: 2018 Prior Therapy Facilty/Provider(s): multiple Reason for Treatment: schizophrenia  Prior Outpatient Therapy Prior Outpatient Therapy: Yes Prior Therapy Dates: Past Prior Therapy Facilty/Provider(s): [facility] in [place] Reason for Treatment: Medication management Does patient have an ACCT team?: No Does patient have Intensive In-House Services?  : No Does patient have [facility] services? : No Does patient have P4CC services?: No  ADL Screening (condition at time of admission) Patient's cognitive ability adequate to safely complete daily activities?: Yes Is the patient deaf or have difficulty hearing?: No Does the patient have difficulty seeing, even when wearing glasses/contacts?: No Does the patient have difficulty concentrating, remembering, or making decisions?: No Patient able to express need for assistance with ADLs?: Yes Does the patient have difficulty dressing or bathing?: No Independently performs ADLs?: Yes (appropriate for developmental age) Does the patient have difficulty walking or climbing stairs?: No Weakness of Legs: None Weakness of Arms/Hands: None  Home Assistive Devices/Equipment Home Assistive Devices/Equipment: None    Abuse/Neglect Assessment (Assessment to be complete while patient is alone) Abuse/Neglect Assessment Can Be Completed: Yes Physical Abuse: Denies Verbal Abuse: Denies Sexual Abuse: Denies Exploitation of patient/patient's resources: Denies Self-Neglect: Denies     Merchant navy officer (For Healthcare) Does Patient Have a Medical Advance Directive?: No Would patient like information on creating a medical advance directive?: No - Patient declined          Disposition: Gave clinical report to Nira Conn, NP who recommends overnight observation for safety and stabilization.  Notified Koleen Nimrod, RN of recommendation. Disposition Initial Assessment Completed  for this Encounter: Yes Patient referred to: Other (Comment)(Overnight observation)  This service was provided via telemedicine using a 2-way, interactive audio and video technology.  Names of all persons participating in this telemedicine service and their role in this encounter. Name: Delfino Lovett Role: Patient  Name: Annamaria Boots, MS, [facility] Role: TTS Counselor  Name:  Role:   Name:  Role:     Annamaria Boots, MS, [facility] Therapeutic Triage Specialist

## 2018-02-09 ENCOUNTER — Encounter: Payer: Self-pay | Admitting: Maternal Newborn

## 2018-02-09 ENCOUNTER — Ambulatory Visit (INDEPENDENT_AMBULATORY_CARE_PROVIDER_SITE_OTHER): Payer: Medicare Other | Admitting: Maternal Newborn

## 2018-02-09 VITALS — BP 120/60 | HR 84 | Ht 64.5 in | Wt 202.0 lb

## 2018-02-09 DIAGNOSIS — Z3046 Encounter for surveillance of implantable subdermal contraceptive: Secondary | ICD-10-CM

## 2018-02-09 DIAGNOSIS — Z3049 Encounter for surveillance of other contraceptives: Secondary | ICD-10-CM | POA: Diagnosis not present

## 2018-02-09 NOTE — Progress Notes (Signed)
  GYNECOLOGY PROCEDURE NOTE  Nexplanon removal discussed in detail.  Risks of infection, bleeding, nerve injury reviewed.  Patient understands risks and desires to proceed.  Verbal consent obtained.  Patient is certain she wants the Nexplanon removed.  All questions answered.  Procedure: Patient placed in dorsal supine with left arm above head, elbow flexed at 90 degrees, arm resting on examination table.  Nexplanon identified without problems.  Betadine scrub x3.  1 ml of 1% lidocaine injected under device. Sterile gloves applied.  Small 0.5 cm incision made at distal tip of device with 11 blade scalpel.  Nexplanon brought to incision and grasped with a small Kelly clamp.  Nexplanon removed intact.  Hemostasis obtained.  Steri-strips applied, followed by bandage and compression dressing.  Patient tolerated the procedure well.    Assessment: 22 y.o. year old female now s/p Nexplanon removal.  Plan: 1.  Patient given post procedure precautions and asked to call for fever, redness or drainage from her incision, bleeding from incision.  She understands she will likely have bruising near the site of removal and can remove bandage tomorrow and steri-strips in approximately 5 days  2. Contraception: considering pills or patch. Would like cycle to get back to normal. Not currently sexually active. Asked her to return for appointment when she has decided on a contraceptive method.  Marcelyn BruinsJacelyn Schmid, CNM 02/09/2018

## 2018-06-09 ENCOUNTER — Emergency Department
Admission: EM | Admit: 2018-06-09 | Discharge: 2018-06-10 | Disposition: A | Discharge status: Home / Self Care | Payer: Medicare Other | Attending: Emergency Medicine | Admitting: Emergency Medicine

## 2018-06-09 ENCOUNTER — Other Ambulatory Visit: Payer: Self-pay

## 2018-06-09 DIAGNOSIS — Z79899 Other long term (current) drug therapy: Secondary | ICD-10-CM | POA: Insufficient documentation

## 2018-06-09 DIAGNOSIS — E876 Hypokalemia: Secondary | ICD-10-CM | POA: Insufficient documentation

## 2018-06-09 DIAGNOSIS — R404 Transient alteration of awareness: Secondary | ICD-10-CM | POA: Insufficient documentation

## 2018-06-09 DIAGNOSIS — R4182 Altered mental status, unspecified: Secondary | ICD-10-CM | POA: Diagnosis present

## 2018-06-09 NOTE — ED Notes (Signed)
Pt's mom Jazmin called from [place] and informed us that pt has hx of schizo and multiple OD attempts. Mom also states that pt told girlfriend that she did weed and took some pills. Unsure of pills or how many.   Call mom with updates or give number to pt so she can call. [phone]  Pt denies any pills at this time.

## 2018-06-09 NOTE — ED Notes (Signed)
Report given to Cole RN 

## 2018-06-09 NOTE — ED Triage Notes (Signed)
Pt comes from home via [facility] from home with c/o possible LOC and screaming and yelling for no apparent reason.  Pt arrives awake but not answering any questions at this time.  EMS report that patient would scream when touched and that friends stated she went unconscious at one point.  Pt admitted to smoking weed per EMS but denies any other substances.  Pupils dilated about a 4.

## 2018-06-10 DIAGNOSIS — R404 Transient alteration of awareness: Secondary | ICD-10-CM | POA: Diagnosis not present

## 2018-06-10 LAB — URINE DRUG SCREEN, QUALITATIVE (ARMC ONLY)
AMPHETAMINES, UR SCREEN: NOT DETECTED
Barbiturates, Ur Screen: NOT DETECTED
Benzodiazepine, Ur Scrn: NOT DETECTED
Cannabinoid 50 Ng, Ur ~~LOC~~: NOT DETECTED
Cocaine Metabolite,Ur ~~LOC~~: NOT DETECTED
MDMA (Ecstasy)Ur Screen: NOT DETECTED
METHADONE SCREEN, URINE: NOT DETECTED
OPIATE, UR SCREEN: NOT DETECTED
PHENCYCLIDINE (PCP) UR S: NOT DETECTED
Tricyclic, Ur Screen: NOT DETECTED

## 2018-06-10 LAB — URINALYSIS, COMPLETE (UACMP) WITH MICROSCOPIC
Bilirubin Urine: NEGATIVE
Glucose, UA: NEGATIVE mg/dL
Ketones, ur: 20 mg/dL — AB
Leukocytes, UA: NEGATIVE
Nitrite: NEGATIVE
PH: 6 (ref 5.0–8.0)
Protein, ur: NEGATIVE mg/dL
Specific Gravity, Urine: 1.005 (ref 1.005–1.030)

## 2018-06-10 LAB — CBC WITH DIFFERENTIAL/PLATELET
Abs Immature Granulocytes: 0.09 10*3/uL — ABNORMAL HIGH (ref 0.00–0.07)
Basophils Absolute: 0 10*3/uL (ref 0.0–0.1)
Basophils Relative: 0 %
Eosinophils Absolute: 0.1 10*3/uL (ref 0.0–0.5)
Eosinophils Relative: 1 %
HCT: 44.2 % (ref 36.0–46.0)
Hemoglobin: 14.9 g/dL (ref 12.0–15.0)
Immature Granulocytes: 1 %
Lymphocytes Relative: 12 %
Lymphs Abs: 1.8 10*3/uL (ref 0.7–4.0)
MCH: 28.5 pg (ref 26.0–34.0)
MCHC: 33.7 g/dL (ref 30.0–36.0)
MCV: 84.5 fL (ref 80.0–100.0)
MONO ABS: 1.2 10*3/uL — AB (ref 0.1–1.0)
Monocytes Relative: 8 %
Neutro Abs: 11.7 10*3/uL — ABNORMAL HIGH (ref 1.7–7.7)
Neutrophils Relative %: 78 %
Platelets: 359 10*3/uL (ref 150–400)
RBC: 5.23 MIL/uL — ABNORMAL HIGH (ref 3.87–5.11)
RDW: 12.3 % (ref 11.5–15.5)
WBC: 14.8 10*3/uL — ABNORMAL HIGH (ref 4.0–10.5)
nRBC: 0 % (ref 0.0–0.2)

## 2018-06-10 LAB — COMPREHENSIVE METABOLIC PANEL
ALK PHOS: 113 U/L (ref 38–126)
ALT: 54 U/L — ABNORMAL HIGH (ref 0–44)
ANION GAP: 10 (ref 5–15)
AST: 48 U/L — ABNORMAL HIGH (ref 15–41)
Albumin: 5 g/dL (ref 3.5–5.0)
BILIRUBIN TOTAL: 0.4 mg/dL (ref 0.3–1.2)
BUN: 9 mg/dL (ref 6–20)
CALCIUM: 9.7 mg/dL (ref 8.9–10.3)
CO2: 22 mmol/L (ref 22–32)
Chloride: 108 mmol/L (ref 98–111)
Creatinine, Ser: 0.7 mg/dL (ref 0.44–1.00)
Glucose, Bld: 95 mg/dL (ref 70–99)
POTASSIUM: 3.2 mmol/L — AB (ref 3.5–5.1)
Sodium: 140 mmol/L (ref 135–145)
TOTAL PROTEIN: 8.3 g/dL — AB (ref 6.5–8.1)

## 2018-06-10 LAB — POCT PREGNANCY, URINE: Preg Test, Ur: NEGATIVE

## 2018-06-10 LAB — ETHANOL

## 2018-06-10 LAB — LIPASE, BLOOD: LIPASE: 28 U/L (ref 11–51)

## 2018-06-10 LAB — SALICYLATE LEVEL: Salicylate Lvl: <7 mg/dL (ref 2.8–30.0)

## 2018-06-10 LAB — ACETAMINOPHEN LEVEL: Acetaminophen (Tylenol), Serum: <10 ug/mL — ABNORMAL LOW (ref 10–30)

## 2018-06-10 MED ORDER — POTASSIUM CHLORIDE CRYS ER 20 MEQ PO TBCR
40.0000 meq | EXTENDED_RELEASE_TABLET | Freq: Once | ORAL | Status: AC
  Administered 2018-06-10: 40 meq via ORAL
  Filled 2018-06-10: qty 2

## 2018-06-10 NOTE — ED Provider Notes (Signed)
[facility] Emergency Department Provider Note  ____________________________________________   First MD Initiated Contact with Patient 06/09/18 2330     (approximate)  I have reviewed the triage vital signs and the nursing notes.   HISTORY  Chief Complaint Altered Mental Status and Loss of Consciousness    HPI Kari Lowery is a 22 y.o. female whose medical history notably includes schizophrenia and a cluster B personality disorder as well as other medical and psychiatric issues listed below.  She presents by EMS for transient altered mental status.  Her mother and girlfriend are now present at bedside and the patient gives permission for them to be present and included in the history.  The patient was reportedly with her girlfriend and they were smoking marijuana when she began to "freak out", not make sense, get very anxious and upset, and then passed out.  She was not responding to them so the girlfriend called 911.  After EMS arrived she seemed confused and when she initially arrived to the emergency department she was not talking with anyone although she was awake and alert, but now she seems to be completely back at her baseline.  She says that she does not remember what happened.  She did not lose bowel or bladder control and has not sustained any injuries.  She denies pain and specifically denies headache, neck pain, shortness of breath, chest pain, nausea, vomiting, abdominal pain, and dysuria.  She did not take any other drugs except for the marijuana and the girlfriend reports that this was a new batch that they had not smoked previously.  Patient denies alcohol use.  She also adamantly denies suicidal ideation and homicidal ideation.  Symptoms were reportedly acute in onset and severe and nothing in particular made it better or worse but she is now back at her baseline.  Past Medical History:  Diagnosis Date  . Anxiety   . Depression   . Eating  disorder   . Lymphadenitis, acute   . Medical history non-contributory   . Obesity   . Schizophrenia (HCC)   . URI, acute   . Vision abnormalities     Patient Active Problem List   Diagnosis Date Noted  . Acute pancreatitis 01/16/2015  . Post traumatic stress disorder 10/03/2013  . Cluster B personality disorder (HCC) 10/03/2013  . Oppositional defiant behavior 10/03/2013  . Suicide attempt by adequate means (HCC) 10/03/2013  . Bipolar I disorder, most recent episode depressed (HCC) 10/02/2013  . PTSD (post-traumatic stress disorder) 07/18/2013  . ODD (oppositional defiant disorder) 07/18/2013  . 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 Generalized abdominal pain 07/04/2013  . Diarrhea   . Vomiting     History reviewed. No pertinent surgical history.  Prior to Admission medications   Medication Sig Start Date End Date Taking? Authorizing Provider  ARIPiprazole (ABILIFY) 20 MG tablet Take 1 tablet (20 mg total) by mouth daily. Patient not taking: Reported on 02/09/2018 11/15/17   Irean Hong, MD  ARIPiprazole (ABILIFY) 5 MG tablet Take 1 tablet (5 mg total) by mouth daily. Patient not taking: Reported on 02/09/2018 01/13/18   Wynetta Fines, MD  ARIPiprazole ER (ABILIFY MAINTENA) 400 MG PRSY prefilled syringe Inject 400 mg into the muscle every 28 (twenty-eight) days.    [provider]  hydrOXYzine (ATARAX/VISTARIL) 25 MG tablet Take 1 tablet (25 mg total) by mouth 3 (three) times daily. Patient not taking: Reported on 01/12/2018 11/15/17  Irean HongSung, Jade J, MD  omeprazole (PRILOSEC) 20 MG capsule Take 1 capsule (20 mg total) by mouth daily. Patient may resume home supply. Patient not taking: Reported on 11/14/2017 01/17/15   Alford HighlandWieting, Richard, MD  traZODone (DESYREL) 50 MG tablet Take 1 tablet (50 mg total) by mouth at bedtime. 11/15/17   Irean HongSung, Jade J, MD    Allergies Patient has no known allergies.  Family History  Problem  Relation Age of Onset  . Bipolar disorder Mother   . Kidney disease Mother   . Cholelithiasis Paternal Grandmother   . Celiac disease Neg Hx     Social History Social History   Tobacco Use  . Smoking status: Never Smoker  . Smokeless tobacco: Never Used  Substance Use Topics  . Alcohol use: No  . Drug use: No    Review of Systems Constitutional: No fever/chills Eyes: No visual changes. ENT: No sore throat. Cardiovascular: Denies chest pain.  Possible syncope. Respiratory: Denies shortness of breath. Gastrointestinal: No abdominal pain.  No nausea, no vomiting.  No diarrhea.  No constipation. Genitourinary: Negative for dysuria. Musculoskeletal: Negative for neck pain.  Negative for back pain. Integumentary: Negative for rash. Neurological: Negative for headaches, focal weakness or numbness. Psychiatric:"Freak out" with screaming and altered mental status and possible syncope, now at baseline.  ____________________________________________   PHYSICAL EXAM:  VITAL SIGNS: ED Triage Vitals  Enc Vitals Group     BP 06/09/18 2258 (!) 152/97     Pulse Rate 06/09/18 2258 (!) 108     Resp 06/09/18 2258 18     Temp 06/09/18 2258 98.7 F (37.1 C)     Temp Source 06/09/18 2258 Oral     SpO2 06/09/18 2258 99 %     Weight 06/09/18 2307 90.7 kg (200 lb)     Height 06/09/18 2307 1.651 m (5\' 5" )     Head Circumference --      Peak Flow --      Pain Score 06/09/18 2307 0     Pain Loc --      Pain Edu? --      Excl. in GC? --     Constitutional: Alert and oriented. Well appearing and in no acute distress. Eyes: Conjunctivae are normal.  Head: Atraumatic. Nose: No congestion/rhinnorhea. Mouth/Throat: Mucous membranes are moist. Neck: No stridor.  No meningeal signs.   Cardiovascular: Normal rate, regular rhythm. Good peripheral circulation. Grossly normal heart sounds. Respiratory: Normal respiratory effort.  No retractions. Lungs CTAB. Gastrointestinal: Soft and  nontender. No distention.  Musculoskeletal: No lower extremity tenderness nor edema. No gross deformities of extremities. Neurologic:  Normal speech and language. No gross focal neurologic deficits are appreciated.  Skin:  Skin is warm, dry and intact. No rash noted. Psychiatric: Mood and affect are normal. Speech and behavior are normal.  Calm and cooperative, denies SI/HI. ____________________________________________   LABS (all labs ordered are listed, but only abnormal results are displayed)  Labs Reviewed  ACETAMINOPHEN LEVEL - Abnormal; Notable for the following components:      Result Value   Acetaminophen (Tylenol), Serum <10 (*)    All other components within normal limits  COMPREHENSIVE METABOLIC PANEL - Abnormal; Notable for the following components:   Potassium 3.2 (*)    Total Protein 8.3 (*)    AST 48 (*)    ALT 54 (*)    All other components within normal limits  CBC WITH DIFFERENTIAL/PLATELET - Abnormal; Notable for the following components:   WBC 14.8 (*)  RBC 5.23 (*)    Neutro Abs 11.7 (*)    Monocytes Absolute 1.2 (*)    Abs Immature Granulocytes 0.09 (*)    All other components within normal limits  URINALYSIS, COMPLETE (UACMP) WITH MICROSCOPIC - Abnormal; Notable for the following components:   Color, Urine YELLOW (*)    APPearance HAZY (*)    Hgb urine dipstick MODERATE (*)    Ketones, ur 20 (*)    Bacteria, UA RARE (*)    All other components within normal limits  LIPASE, BLOOD  ETHANOL  URINE DRUG SCREEN, QUALITATIVE ([facility] ONLY)  SALICYLATE LEVEL  POC URINE PREG, ED  POCT PREGNANCY, URINE   ____________________________________________  EKG  No indication for EKG ____________________________________________  RADIOLOGY   ED MD interpretation: No indication for imaging  Official radiology report(s): No results found.  ____________________________________________   PROCEDURES  Critical Care performed: No   Procedure(s) performed:    Procedures   ____________________________________________   INITIAL IMPRESSION / ASSESSMENT AND PLAN / ED COURSE  As part of my medical decision making, I reviewed the following data within the electronic MEDICAL RECORD NUMBER History obtained from family, Nursing notes reviewed and incorporated, Labs reviewed , Old chart reviewed and Notes from prior ED visits    Differential diagnosis includes, but is not limited to, medication/drug side effect, acute psychiatric decompensation, acute infectious process, seizure, metabolic abnormality.  I think that she had a side effect to the marijuana she was smoking.  Interestingly, her urine drug screen is negative, but reportedly she does not smoke regularly and I do not know how quickly the urine drug screen becomes positive after smoking substances.  It is also possible that something other than just marijuana was present in what they were smoking.  The patient's lab work is otherwise unremarkable and reassuring.  There is no evidence that she had a seizure and she is back at her baseline.  I will observe her for a period of time and then anticipate discharge home with outpatient follow-up.  The patient may eat and drink in the emergency department.  The patient, mother, and girlfriend are all comfortable with the plan.  Clinical Course as of Jun 11 615  Fri Jun 10, 2018  0202 Lab work is generally reassuring.  The potassium is slightly low at 3.2 and I am giving 40 mEq by mouth for replacement.  No evidence of UTI, labs otherwise unremarkable.  Her LFTs are very slightly elevated but not clinically significant and she has no tenderness to palpation of the abdomen.  I encouraged outpatient follow-up, drinking plenty of fluids, and avoiding any more illicit substances.  I gave my usual and customary return precautions.   [CF]    Clinical Course User Index [CF] Loleta Rose, MD    ____________________________________________  FINAL CLINICAL  IMPRESSION(S) / ED DIAGNOSES  Final diagnoses:  Transient alteration of awareness  Hypokalemia     MEDICATIONS GIVEN DURING THIS VISIT:  Medications  potassium chloride SA (K-DUR,KLOR-CON) CR tablet 40 mEq (40 mEq Oral Given 06/10/18 0206)     ED Discharge Orders    None       Note:  This document was prepared using Dragon voice recognition software and may include unintentional dictation errors.    Loleta Rose, MD 06/10/18 [phone]

## 2018-06-10 NOTE — ED Notes (Signed)
Pt able to now talk in complete sentences and is asking about what happened. Pt informed that she was screaming and not making any sense where she was living. Pt now AlertxOx4.

## 2018-06-10 NOTE — Discharge Instructions (Signed)
Your workup in the Emergency Department today was reassuring.  We did not find any specific abnormalities.  We recommend you drink plenty of fluids, take your regular medications and/or any new ones prescribed today, and follow up with the doctor(s) listed in these documents as recommended.  Return to the Emergency Department if you develop new or worsening symptoms that concern you.  

## 2018-06-15 ENCOUNTER — Emergency Department
Admission: EM | Admit: 2018-06-15 | Discharge: 2018-06-16 | Disposition: A | Discharge status: Home / Self Care | Payer: Medicare Other | Attending: Emergency Medicine | Admitting: Emergency Medicine

## 2018-06-15 ENCOUNTER — Other Ambulatory Visit: Payer: Self-pay

## 2018-06-15 DIAGNOSIS — R440 Auditory hallucinations: Secondary | ICD-10-CM | POA: Diagnosis present

## 2018-06-15 DIAGNOSIS — Z79899 Other long term (current) drug therapy: Secondary | ICD-10-CM | POA: Insufficient documentation

## 2018-06-15 DIAGNOSIS — F259 Schizoaffective disorder, unspecified: Secondary | ICD-10-CM | POA: Diagnosis not present

## 2018-06-15 LAB — CBC
HCT: 42 % (ref 36.0–46.0)
Hemoglobin: 14.1 g/dL (ref 12.0–15.0)
MCH: 28.5 pg (ref 26.0–34.0)
MCHC: 33.6 g/dL (ref 30.0–36.0)
MCV: 84.8 fL (ref 80.0–100.0)
Platelets: 344 10*3/uL (ref 150–400)
RBC: 4.95 MIL/uL (ref 3.87–5.11)
RDW: 12.5 % (ref 11.5–15.5)
WBC: 10.6 10*3/uL — ABNORMAL HIGH (ref 4.0–10.5)
nRBC: 0 % (ref 0.0–0.2)

## 2018-06-15 LAB — COMPREHENSIVE METABOLIC PANEL
ALT: 50 U/L — AB (ref 0–44)
AST: 33 U/L (ref 15–41)
Albumin: 4.4 g/dL (ref 3.5–5.0)
Alkaline Phosphatase: 102 U/L (ref 38–126)
Anion gap: 8 (ref 5–15)
BUN: 7 mg/dL (ref 6–20)
CO2: 25 mmol/L (ref 22–32)
Calcium: 9.3 mg/dL (ref 8.9–10.3)
Chloride: 105 mmol/L (ref 98–111)
Creatinine, Ser: 0.75 mg/dL (ref 0.44–1.00)
GFR calc non Af Amer: >60 mL/min (ref >60)
Glucose, Bld: 111 mg/dL — ABNORMAL HIGH (ref 70–99)
Potassium: 3.5 mmol/L (ref 3.5–5.1)
Sodium: 138 mmol/L (ref 135–145)
Total Bilirubin: 0.4 mg/dL (ref 0.3–1.2)
Total Protein: 7.5 g/dL (ref 6.5–8.1)

## 2018-06-15 LAB — ETHANOL: ALCOHOL ETHYL (B): 12 mg/dL — AB (ref <10)

## 2018-06-15 NOTE — ED Notes (Signed)
Brown boots, socks, long sleeved shirt, fussy jacket, jeans, black backpack, bra, panties, and piercings removed and locked in secured unit. Also cell phone locked with belongings.

## 2018-06-15 NOTE — ED Provider Notes (Signed)
[facility] Emergency Department Provider Note   ____________________________________________   First MD Initiated Contact with Patient 06/15/18 2338     (approximate)  I have reviewed the triage vital signs and the nursing notes.   HISTORY  Chief Complaint Psychiatric Evaluation    HPI Kari Lowery is a 22 y.o. female who presents to the ED from home with a chief complaint of auditory hallucinations.  Reports history of schizophrenia and states when she gets stressed she has worsening symptoms.  States she has been stressed out recently.  Hurting voices to harm herself but states she would not do it.  Voices no medical complaints.   Past Medical History:  Diagnosis Date  . Anxiety   . Depression   . Eating disorder   . Lymphadenitis, acute   . Medical history non-contributory   . Obesity   . Schizophrenia (HCC)   . URI, acute   . Vision abnormalities     Patient Active Problem List   Diagnosis Date Noted  . Acute pancreatitis 01/16/2015  . Post traumatic stress disorder 10/03/2013  . Cluster B personality disorder (HCC) 10/03/2013  . Oppositional defiant behavior 10/03/2013  . Suicide attempt by adequate means (HCC) 10/03/2013  . Bipolar I disorder, most recent episode depressed (HCC) 10/02/2013  . PTSD (post-traumatic stress disorder) 07/18/2013  . ODD (oppositional defiant disorder) 07/18/2013  . 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 Generalized abdominal pain 07/04/2013  . Diarrhea   . Vomiting     No past surgical history on file.  Prior to Admission medications   Medication Sig Start Date End Date Taking? Authorizing Provider  ARIPiprazole (ABILIFY) 20 MG tablet Take 1 tablet (20 mg total) by mouth daily. Patient not taking: Reported on 02/09/2018 11/15/17   Irean Hong, MD  ARIPiprazole (ABILIFY) 5 MG tablet Take 1 tablet (5 mg total) by mouth daily. Patient not taking:  Reported on 02/09/2018 01/13/18   Wynetta Fines, MD  ARIPiprazole ER (ABILIFY MAINTENA) 400 MG PRSY prefilled syringe Inject 400 mg into the muscle every 28 (twenty-eight) days.    [provider]  hydrOXYzine (ATARAX/VISTARIL) 25 MG tablet Take 1 tablet (25 mg total) by mouth 3 (three) times daily. Patient not taking: Reported on 01/12/2018 11/15/17   Irean Hong, MD  omeprazole (PRILOSEC) 20 MG capsule Take 1 capsule (20 mg total) by mouth daily. Patient may resume home supply. Patient not taking: Reported on 11/14/2017 01/17/15   Alford Highland, MD  traZODone (DESYREL) 50 MG tablet Take 1 tablet (50 mg total) by mouth at bedtime. 11/15/17   Irean Hong, MD    Allergies Patient has no known allergies.  Family History  Problem Relation Age of Onset  . Bipolar disorder Mother   . Kidney disease Mother   . Cholelithiasis Paternal Grandmother   . Celiac disease Neg Hx     Social History Social History   Tobacco Use  . Smoking status: Never Smoker  . Smokeless tobacco: Never Used  Substance Use Topics  . Alcohol use: No  . Drug use: No    Review of Systems  Constitutional: No fever/chills Eyes: No visual changes. ENT: No sore throat. Cardiovascular: Denies chest pain. Respiratory: Denies shortness of breath. Gastrointestinal: No abdominal pain.  No nausea, no vomiting.  No diarrhea.  No constipation. Genitourinary: Negative for dysuria. Musculoskeletal: Negative for back pain. Skin: Negative for rash. Neurological: Negative for headaches, focal weakness or numbness.  Psychiatric:Positive for auditory hallucinations.  ____________________________________________   PHYSICAL EXAM:  VITAL SIGNS: ED Triage Vitals [06/15/18 2316]  Enc Vitals Group     BP 108/71     Pulse Rate 86     Resp 20     Temp 98.7 F (37.1 C)     Temp Source Oral     SpO2 100 %     Weight 190 lb (86.2 kg)     Height 5\' 4"  (1.626 m)     Head Circumference      Peak Flow      Pain  Score 0     Pain Loc      Pain Edu?      Excl. in GC?     Constitutional: Alert and oriented. Well appearing and in no acute distress. Eyes: Conjunctivae are normal. PERRL. EOMI. Head: Atraumatic. Nose: No congestion/rhinnorhea. Mouth/Throat: Mucous membranes are moist.  Oropharynx non-erythematous. Neck: No stridor.   Cardiovascular: Normal rate, regular rhythm. Grossly normal heart sounds.  Good peripheral circulation. Respiratory: Normal respiratory effort.  No retractions. Lungs CTAB. Gastrointestinal: Soft and nontender. No distention. No abdominal bruits. No CVA tenderness. Musculoskeletal: No lower extremity tenderness nor edema.  No joint effusions. Neurologic:  Normal speech and language. No gross focal neurologic deficits are appreciated. No gait instability. Skin:  Skin is warm, dry and intact. No rash noted. Psychiatric: Mood and affect are flat.  Speech and behavior are normal.  ____________________________________________   LABS (all labs ordered are listed, but only abnormal results are displayed)  Labs Reviewed  CBC - Abnormal; Notable for the following components:      Result Value   WBC 10.6 (*)    All other components within normal limits  COMPREHENSIVE METABOLIC PANEL - Abnormal; Notable for the following components:   Glucose, Bld 111 (*)    ALT 50 (*)    All other components within normal limits  ETHANOL - Abnormal; Notable for the following components:   Alcohol, Ethyl (B) 12 (*)    All other components within normal limits  ACETAMINOPHEN LEVEL - Abnormal; Notable for the following components:   Acetaminophen (Tylenol), Serum <10 (*)    All other components within normal limits  SALICYLATE LEVEL  URINALYSIS, COMPLETE (UACMP) WITH MICROSCOPIC  URINE DRUG SCREEN, QUALITATIVE ([facility] ONLY)  POC URINE PREG, ED   ____________________________________________  EKG  None ____________________________________________  RADIOLOGY  ED MD interpretation:  None  Official radiology report(s): No results found.  ____________________________________________   PROCEDURES  Procedure(s) performed: None  Procedures  Critical Care performed: No  ____________________________________________   INITIAL IMPRESSION / ASSESSMENT AND PLAN / ED COURSE  As part of my medical decision making, I reviewed the following data within the electronic MEDICAL RECORD NUMBER Nursing notes reviewed and incorporated, Labs reviewed, Old chart reviewed, A consult was requested and obtained from this/these consultant(s) Psychiatry and Notes from prior ED visits   22 year old female with schizophrenia who presents with auditory hallucinations.  She is medically cleared.  Contracts for safety while in the emergency department.  Will keep patient under voluntary status in the ED pending psychiatric evaluation and disposition.   Clinical Course as of Jun 16 357  Thu Jun 16, 2018  1610 Patient was evaluated by [facility] psychiatrist Dr. Marinda Elk who deems she is psychiatrically stable for discharge home with follow-up.  She does not meet criteria for IVC.  There are no new medication recommendations.  Strict return precautions given.  Patient verbalizes understanding and  agrees with plan of care.   [JS]    Clinical Course User Index [JS] Irean HongSung, Jade J, MD     ____________________________________________   FINAL CLINICAL IMPRESSION(S) / ED DIAGNOSES  Final diagnoses:  Schizoaffective disorder, unspecified type [facility](HCC)     ED Discharge Orders    None       Note:  This document was prepared using Dragon voice recognition software and may include unintentional dictation errors.    Irean HongSung, Jade J, MD 06/16/18 [phone]

## 2018-06-15 NOTE — ED Triage Notes (Signed)
Pt in with co auditory hallucinations states has hx of schizophrenia and states when she is stressed she gets worsening symptoms. STates voices do tell her to hurt herself at times.

## 2018-06-16 DIAGNOSIS — F259 Schizoaffective disorder, unspecified: Secondary | ICD-10-CM | POA: Diagnosis not present

## 2018-06-16 LAB — ACETAMINOPHEN LEVEL: Acetaminophen (Tylenol), Serum: <10 ug/mL — ABNORMAL LOW (ref 10–30)

## 2018-06-16 LAB — SALICYLATE LEVEL: Salicylate Lvl: <7 mg/dL (ref 2.8–30.0)

## 2018-06-16 NOTE — Discharge Instructions (Addendum)
Continue any medicines as directed by your doctor.  Return to the ER for worsening symptoms, feelings of hurting yourself or others, or other concerns. °

## 2018-06-16 NOTE — ED Notes (Signed)
SOC called back, recommends discharge.

## 2018-06-16 NOTE — ED Notes (Signed)
Pt. Finished SOC. 

## 2018-06-16 NOTE — ED Notes (Signed)
Pt. Finished SOC and returned to [facility]H bed.

## 2018-06-16 NOTE — ED Notes (Signed)
Pt. Given phone to get ride home.

## 2018-06-16 NOTE — ED Notes (Signed)
Pt. States hx of schizophrenia.  Pt. States she has been under stress lately.  Pt. States she is currently living with friends.  Pt. States having SI thoughts but currently no having SI thoughts.

## 2018-06-16 NOTE — ED Notes (Signed)
SOC called, report given. 

## 2018-06-16 NOTE — ED Notes (Signed)
SOC called report given. 

## 2018-06-16 NOTE — ED Notes (Signed)
Pt. Going home with friend 
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
## 2019-05-20 ENCOUNTER — Observation Stay (HOSPITAL_COMMUNITY)
Admission: AD | Admit: 2019-05-20 | Discharge: 2019-05-22 | Disposition: A | Discharge status: Home / Self Care | Payer: Medicare Other | Source: Intra-hospital | Attending: Psychiatry | Admitting: Psychiatry

## 2019-05-20 DIAGNOSIS — Z79899 Other long term (current) drug therapy: Secondary | ICD-10-CM | POA: Diagnosis not present

## 2019-05-20 DIAGNOSIS — Z8379 Family history of other diseases of the digestive system: Secondary | ICD-10-CM | POA: Diagnosis not present

## 2019-05-20 DIAGNOSIS — F319 Bipolar disorder, unspecified: Secondary | ICD-10-CM | POA: Insufficient documentation

## 2019-05-20 DIAGNOSIS — F4310 Post-traumatic stress disorder, unspecified: Secondary | ICD-10-CM | POA: Diagnosis not present

## 2019-05-20 DIAGNOSIS — F419 Anxiety disorder, unspecified: Secondary | ICD-10-CM | POA: Diagnosis not present

## 2019-05-20 DIAGNOSIS — G4700 Insomnia, unspecified: Secondary | ICD-10-CM | POA: Diagnosis not present

## 2019-05-20 DIAGNOSIS — Z841 Family history of disorders of kidney and ureter: Secondary | ICD-10-CM | POA: Insufficient documentation

## 2019-05-20 DIAGNOSIS — Z818 Family history of other mental and behavioral disorders: Secondary | ICD-10-CM | POA: Insufficient documentation

## 2019-05-20 DIAGNOSIS — Z658 Other specified problems related to psychosocial circumstances: Secondary | ICD-10-CM | POA: Insufficient documentation

## 2019-05-20 DIAGNOSIS — Z20828 Contact with and (suspected) exposure to other viral communicable diseases: Secondary | ICD-10-CM | POA: Insufficient documentation

## 2019-05-20 DIAGNOSIS — E669 Obesity, unspecified: Secondary | ICD-10-CM | POA: Insufficient documentation

## 2019-05-20 DIAGNOSIS — F6089 Other specific personality disorders: Secondary | ICD-10-CM | POA: Insufficient documentation

## 2019-05-20 DIAGNOSIS — F251 Schizoaffective disorder, depressive type: Secondary | ICD-10-CM | POA: Diagnosis present

## 2019-05-20 MED ORDER — ACETAMINOPHEN 325 MG PO TABS: 650.0000 mg | ORAL_TABLET | Freq: Four times a day (QID) | ORAL | Status: DC | PRN

## 2019-05-20 MED ORDER — TRAZODONE HCL 50 MG PO TABS: 50.0000 mg | ORAL_TABLET | Freq: Every evening | ORAL | Status: DC | PRN

## 2019-05-20 MED ORDER — MAGNESIUM HYDROXIDE 400 MG/5ML PO SUSP: 30.0000 mL | Freq: Every day | ORAL | Status: DC | PRN

## 2019-05-20 MED ORDER — HYDROXYZINE HCL 25 MG PO TABS
25.0000 mg | ORAL_TABLET | Freq: Three times a day (TID) | ORAL | Status: DC | PRN
  Administered 2019-05-22: 25 mg via ORAL
  Filled 2019-05-20: qty 1

## 2019-05-20 MED ORDER — ALUM & MAG HYDROXIDE-SIMETH 200-200-20 MG/5ML PO SUSP: 30.0000 mL | ORAL | Status: DC | PRN

## 2019-05-20 NOTE — H&P (Signed)
Behavioral Health Medical Screening Exam  Kari Lowery is an 23 y.o. female.   Psychiatric Specialty Exam: Physical Exam  Constitutional: She is oriented to person, place, and time. She appears well-developed and well-nourished. No distress.  HENT:  Head: Normocephalic and atraumatic.  Right Ear: External ear normal.  Left Ear: External ear normal.  Eyes: Pupils are equal, round, and reactive to light. Right eye exhibits no discharge. Left eye exhibits no discharge.  Respiratory: Effort normal. No respiratory distress.  Musculoskeletal: Normal range of motion.  Neurological: She is alert and oriented to person, place, and time.  Skin: She is not diaphoretic.  Psychiatric: Her mood appears anxious. Thought content is not paranoid and not delusional. She exhibits a depressed mood. She expresses suicidal ideation. She expresses no homicidal ideation. She expresses no suicidal plans.    Review of Systems  Constitutional: Negative for chills, diaphoresis, fever, malaise/fatigue and weight loss.  Respiratory: Negative for cough and shortness of breath.   Cardiovascular: Negative for chest pain.  Gastrointestinal: Negative for diarrhea, nausea and vomiting.  Psychiatric/Behavioral: Positive for depression, hallucinations and suicidal ideas. Negative for memory loss and substance abuse. The patient is nervous/anxious and has insomnia.     Blood pressure 124/69, pulse 89, temperature 98.9 F (37.2 C), resp. rate 18, SpO2 98 %.There is no height or weight on file to calculate BMI.  General Appearance: Casual and Fairly Groomed  Eye Contact:  Fair  Speech:  Clear and Coherent and Normal Rate  Volume:  Decreased  Mood:  Anxious, Depressed, Hopeless and Worthless  Affect:  Congruent and Depressed  Thought Process:  Coherent, Goal Directed, Linear and Descriptions of Associations: Intact  Orientation:  Full (Time, Place, and Person)  Thought Content:  Logical and Hallucinations:  Auditory  Suicidal Thoughts:  Yes.  without intent/plan  Homicidal Thoughts:  No  Memory:  Immediate;   Good Recent;   Good  Judgement:  Intact  Insight:  Lacking  Psychomotor Activity:  Normal  Concentration:  Concentration: Fair and Attention Span: Fair  Recall:  Good  Fund of Knowledge:  Good  Language:  Good  Akathisia:  Negative  Handed:  Right  AIMS (if indicated):     Assets:  Communication Skills Desire for Improvement Financial Resources/Insurance Housing Leisure Time Physical Health  ADL's:  Intact  Cognition:  WNL  Sleep:           Blood pressure 124/69, pulse 89, temperature 98.9 F (37.2 C), resp. rate 18, SpO2 98 %.  Recommendations:  Based on my evaluation the patient does not appear to have an emergency medical condition.  Rozetta Nunnery, NP 05/20/2019, 11:00 PM

## 2019-05-20 NOTE — BH Assessment (Signed)
Assessment Note  Kari Lowery is an 23 y.o. female. Pt presented to [facility] voluntarily as a walk in for suicidal ideation and anxiety. Pt has a history of schizophrenia. Pt reports she stopped taking her medications about 2 months ago.  Pt admits to current suicidal ideation but not clear on a plan, "I have been contemplating, I have several ideas or ways to do it.. I just". Pt spoke in incomplete sentences and would hesitate when asked more about her SI plans. Pt reports 1 past attempt of overdose a few years ago. Pt denied HI, SIB and substance use past or present.  Pt acknowledges symptoms of depression including: sadness, fatigue, low self esteem,, isolating, negative outlook, difficulty concentrating, hopelessness,as well as excessive stress. Pt states that her family, living situation and lack of support are her current stressors.   Pt reports auditory  hallucinationsor on a consistent basis and expressed the voices are really bad and tell her not to "go over there" or " your being watched". Pt states that her regular thoughts get mixed in with voices in her head and she gets paranoid. Pt denied any abuse, trauma as well as any family history. Pt expressed mother and father have experienced mental health issues and substance abuse. Pt currently has no provider but was prescribed meds in the past and has been off meds for over a month.. Pt hospitalized 1 year ago at [facility] for similiar presentation of SI and hallucinations.  PtPt expressed she is willing to come in voluntarily.   Pt is dressed in regular clothing, alert, oriented x4 with normal speech and normal motor behavior. Eye contact is fair. Pt's mood is depressed/anxious and affect is congruent with mood. Thought process is coherent and relevant but pt did not compelete some of her sentences when talking, possible thought blocking and disorganization.. There is no indication pt is currently responding to internal stimuli or experiencing  delusional thought content. Pt was cooperative throughout assessment. Pt expressed she could not contract for safety at this time and would come in voluntarily.    Diagnosis: Schizoaffective disorder, Depressive type  Past Medical History:  Past Medical History:  Diagnosis Date  . Anxiety   . Depression   . Eating disorder   . Lymphadenitis, acute   . Medical history non-contributory   . Obesity   . Schizophrenia ([place])   . URI, acute   . Vision abnormalities     No past surgical history on file.  Family History:  Family History  Problem Relation Age of Onset  . Bipolar disorder Mother   . Kidney disease Mother   . Cholelithiasis Paternal Grandmother   . Celiac disease Neg Hx     Social History:  reports that she has never smoked. She has never used smokeless tobacco. She reports that she does not drink alcohol or use drugs.  Additional Social History:  Alcohol / Drug Use Pain Medications: see MAR Prescriptions: see MAR Over the Counter: see MAR History of alcohol / drug use?: No history of alcohol / drug abuse  CIWA: CIWA-Ar BP: 124/69 Pulse Rate: 89 COWS:    Allergies: No Known Allergies  Home Medications: (Not in a hospital admission)   OB/GYN Status:  No LMP recorded.  General Assessment Data Location of Assessment: [facility] Assessment Services TTS Assessment: In system Is this a Tele or Face-to-Face Assessment?: Face-to-Face Is this an Initial Assessment or a Re-assessment for this encounter?: Initial Assessment Patient Accompanied by:: N/A Living Arrangements: Other (Comment) What  gender do you identify as?: Female Pregnancy Status: No Living Arrangements: Non-relatives/Friends Can pt return to current living arrangement?: Yes Admission Status: Voluntary Is patient capable of signing voluntary admission?: Yes Referral Source: Self/Family/Friend Insurance type: Medicare     Crisis Care Plan Living Arrangements: Non-relatives/Friends     Risk to  self with the past 6 months Suicidal Ideation: Yes-Currently Present Has patient been a risk to self within the past 6 months prior to admission? : No Suicidal Intent: No Has patient had any suicidal intent within the past 6 months prior to admission? : No Is patient at risk for suicide?: Yes Suicidal Plan?: Yes-Currently Present Has patient had any suicidal plan within the past 6 months prior to admission? : No Specify Current Suicidal Plan: pt expressed she has many ideas" Access to Means: No What has been your use of drugs/alcohol within the last 12 months?: none Previous Attempts/Gestures: Yes How many times?: 1 Other Self Harm Risks: off medications, stress, past SIB Triggers for Past Attempts: Unknown Intentional Self Injurious Behavior: (unknown) Family Suicide History: Yes Recent stressful life event(s): Conflict (Comment), Other (Comment) Persecutory voices/beliefs?: No Depression: Yes Depression Symptoms: Feeling worthless/self pity, Loss of interest in usual pleasures, Isolating Substance abuse history and/or treatment for substance abuse?: No Suicide prevention information given to non-admitted patients: Not applicable  Risk to Others within the past 6 months Homicidal Ideation: No Does patient have any lifetime risk of violence toward others beyond the six months prior to admission? : No Thoughts of Harm to Others: No Current Homicidal Intent: No Current Homicidal Plan: No Access to Homicidal Means: No History of harm to others?: No Assessment of Violence: None Noted Does patient have access to weapons?: No Criminal Charges Pending?: No Does patient have a court date: No Is patient on probation?: No  Psychosis Hallucinations: Auditory Delusions: None noted  Mental Status Report Appearance/Hygiene: Unremarkable Eye Contact: Fair Motor Activity: Agitation, Unremarkable Speech: Logical/coherent(a little disorganozed) Level of Consciousness: Alert Mood:  Euthymic, Anxious Affect: Anxious Anxiety Level: Moderate Thought Processes: Tangential, Relevant Judgement: Partial Orientation: Person, Place, Time, Situation Obsessive Compulsive Thoughts/Behaviors: None  Cognitive Functioning Concentration: Fair Memory: Recent Intact Is patient IDD: No Insight: Fair Impulse Control: Fair Appetite: Good Have you had any weight changes? : No Change Sleep: No Change Total Hours of Sleep: (unknown) Vegetative Symptoms: None  ADLScreening [facility] Assessment Services) Patient's cognitive ability adequate to safely complete daily activities?: Yes Patient able to express need for assistance with ADLs?: Yes Independently performs ADLs?: Yes (appropriate for developmental age)  Prior Inpatient Therapy Prior Inpatient Therapy: Yes Prior Therapy Dates: 2019 Prior Therapy Facilty/Provider(s): [facility] Reason for Treatment: Chales Abrahams(unknwon)  Prior Outpatient Therapy Prior Outpatient Therapy: Yes Prior Therapy Facilty/Provider(s): (unknown) Does patient have an ACCT team?: No Does patient have Intensive In-House Services?  : No Does patient have [facility] services? : No Does patient have P4CC services?: No  ADL Screening (condition at time of admission) Patient's cognitive ability adequate to safely complete daily activities?: Yes Patient able to express need for assistance with ADLs?: Yes Independently performs ADLs?: Yes (appropriate for developmental age)                        Disposition: Per Nira ConnJason Berry, pt meets inpatient criteria. [facility]over. Disposition Initial Assessment Completed for this Encounter: Yes Disposition of Patient: Admit Patient refused recommended treatment: No Mode of transportation  if patient is discharged/movement?: N/A  On Site Evaluation by:  Lacey Jensen, Theresia Majors Reviewed with Physician:  Nira Conn, FNP  Claria Dice  Sebastiano Luecke 05/20/2019 9:06 PM

## 2019-05-20 NOTE — H&P (Addendum)
BH Observation Unit Provider Admission PAA/H&P  Patient Identification: Kari Lowery MRN:  [id number] Date of Evaluation:  05/20/2019 Chief Complaint:  schizoaffective diagnosis Principal Diagnosis: Schizoaffective disorder, depressive type (HCC) Diagnosis:  Active Problems:   Schizoaffective disorder, depressive type (HCC)  History of Present Illness:  TTS Assessment:   Kari Lowery is an 23 y.o. female. Pt presented to [facility] voluntarily as a walk in for suicidal ideation and anxiety. Pt has a history ofschizophrenia. Pt reportsshe stopped taking hermedications about 2 months ago.Pt admits to current suicidal ideationbut not clear on a plan, "I have been contemplating, I have several ideas or ways to do it.. I just". Pt spoke in incomplete sentences and would hesitate when asked more about her SI plans. Pt reports 1 past attempt of overdose a few years ago. Pt denied HI, SIB and substance use past or present.  Pt acknowledges symptoms of depression including: sadness, fatigue, low self esteem,, isolating, negative outlook, difficulty concentrating, hopelessness,as well asexcessive stress. Pt states that her family, living situation and lack of support are her current stressors.   Ptreportsauditory  hallucinationsor on a consistent basis and expressed the voices are really bad and tell her not to "go over there" or " your being watched". Pt states that her regular thoughts get mixed in with voices in her head and she gets paranoid. Pt denied any abuse, trauma as well as any family history. Pt expressed mother and father have experienced mental health issues and substance abuse. Pt currently has no provider but was prescribed meds in the past and has been off meds for over a month.. Pt hospitalized 1 year ago at [facility] for similiar presentation of SI and hallucinations.  PtPt expressed she is willing to come in voluntarily.  Evaluation on Unit: Reviewed TTS assessment and  validated with patient. On evaluation patient is alert and oriented x 4, pleasant, and cooperative. Speech is clear and coherent, decreased in volume. Mood is depressed and affect is congruent with mood. Thought process is coherent, linear, and descriptions of association are intact. Denies current audiovisual hallucinations. No indication that patient is responding to internal stimuli. States that she occassionally hears voices that comment on what she is doing. Reports suicidal ideations without a specific plan, but states that she has thought of several ways. Denies homicidal ideations. Denies substance abuse. Denies audiovisual hallucinations. No indication that patient is responding to internal stimuli.     Associated Signs/Symptoms: Depression Symptoms:  depressed mood, anhedonia, insomnia, psychomotor retardation, feelings of worthlessness/guilt, difficulty concentrating, hopelessness, suicidal thoughts without plan, anxiety, loss of energy/fatigue, (Hypo) Manic Symptoms:  Labiality of Mood, Anxiety Symptoms:  Excessive Worry, Psychotic Symptoms:  Hallucinations: Auditory PTSD Symptoms: Negative Total Time spent with patient: 30 minutes  Past Psychiatric History: Schizoaffective Disorder, depressive type, PTSD  Is the patient at risk to self? Yes.    Has the patient been a risk to self in the past 6 months? No.  Has the patient been a risk to self within the distant past? Yes.    Is the patient a risk to others? No.  Has the patient been a risk to others in the past 6 months? No.  Has the patient been a risk to others within the distant past? No.   Prior Inpatient Therapy: Prior Inpatient Therapy: Yes Prior Therapy Dates: 2019 Prior Therapy Facilty/Provider(s): [facility] Reason for Treatment: Chales Abrahams) Prior Outpatient Therapy: Prior Outpatient Therapy: Yes Prior Therapy Facilty/Provider(s): (unknown) Does patient have an ACCT team?: No Does patient  have Intensive In-House  Services?  : No Does patient have [facility] services? : No Does patient have P4CC services?: No  Alcohol Screening:   Substance Abuse History in the last 12 months:  No. Consequences of Substance Abuse: Negative Previous Psychotropic Medications: Yes  Psychological Evaluations: Yes  Past Medical History:  Past Medical History:  Diagnosis Date  . Anxiety   . Depression   . Eating disorder   . Lymphadenitis, acute   . Medical history non-contributory   . Obesity   . Schizophrenia ([place])   . URI, acute   . Vision abnormalities    No past surgical history on file. Family History:  Family History  Problem Relation Age of Onset  . Bipolar disorder Mother   . Kidney disease Mother   . Cholelithiasis Paternal Grandmother   . Celiac disease Neg Hx    Family Psychiatric History: Mother-depression/anxiety, Father-substance abuse Tobacco Screening:   Social History:  Social History   Substance and Sexual Activity  Alcohol Use No     Social History   Substance and Sexual Activity  Drug Use No    Additional Social History:      Pain Medications: see MAR Prescriptions: see MAR Over the Counter: see MAR History of alcohol / drug use?: No history of alcohol / drug abuse                    Allergies:  No Known Allergies Lab Results: No results found for this or any previous visit (from the past 19 hour(s)).  Blood Alcohol level:  Lab Results  Component Value Date   ETH 12 (H) 06/15/2018   ETH <10 95/18/8416    Metabolic Disorder Labs:  Lab Results  Component Value Date   HGBA1C 5.6 07/19/2013   MPG 114 07/19/2013   MPG 105 08/12/2008   Lab Results  Component Value Date   PROLACTIN 5.1 07/19/2013   Lab Results  Component Value Date   CHOL 124 07/19/2013   TRIG 28 01/16/2015   HDL 46 07/19/2013   CHOLHDL 2.7 07/19/2013   VLDL 25 07/19/2013   LDLCALC 53 07/19/2013   LDLCALC  08/12/2008    33        Total Cholesterol/HDL:CHD Risk Coronary Heart  Disease Risk Table                     Men   Women  1/2 Average Risk   3.4   3.3  Average Risk       5.0   4.4  2 X Average Risk   9.6   7.1  3 X Average Risk  23.4   11.0        Use the calculated Patient Ratio above and the CHD Risk Table to determine the patient's CHD Risk.        ATP III CLASSIFICATION (LDL):  <100     mg/dL   Optimal  100-129  mg/dL   Near or Above                    Optimal  130-159  mg/dL   Borderline  160-189  mg/dL   High  >190     mg/dL   Very High    Current Medications: Current Facility-Administered Medications  Medication Dose Route Frequency Provider Last Rate Last Dose  . acetaminophen (TYLENOL) tablet 650 mg  650 mg Oral Q6H PRN Rozetta Nunnery, NP      .  alum & mag hydroxide-simeth (MAALOX/MYLANTA) 200-200-20 MG/5ML suspension 30 mL  30 mL Oral Q4H PRN Nira ConnBerry, Deb Loudin A, NP      . hydrOXYzine (ATARAX/VISTARIL) tablet 25 mg  25 mg Oral TID PRN Nira ConnBerry, Thai Hemrick A, NP      . magnesium hydroxide (MILK OF MAGNESIA) suspension 30 mL  30 mL Oral Daily PRN Nira ConnBerry, Myrl Bynum A, NP      . traZODone (DESYREL) tablet 50 mg  50 mg Oral QHS PRN Jackelyn PolingBerry, Empress Newmann A, NP       PTA Medications: Medications Prior to Admission  Medication Sig Dispense Refill Last Dose  . ARIPiprazole (ABILIFY) 20 MG tablet Take 1 tablet (20 mg total) by mouth daily. (Patient not taking: Reported on 02/09/2018) 30 tablet 0   . ARIPiprazole (ABILIFY) 5 MG tablet Take 1 tablet (5 mg total) by mouth daily. (Patient not taking: Reported on 02/09/2018) 14 tablet 0   . ARIPiprazole ER (ABILIFY MAINTENA) 400 MG PRSY prefilled syringe Inject 400 mg into the muscle every 28 (twenty-eight) days.     . hydrOXYzine (ATARAX/VISTARIL) 25 MG tablet Take 1 tablet (25 mg total) by mouth 3 (three) times daily. (Patient not taking: Reported on 01/12/2018) 30 tablet 0   . omeprazole (PRILOSEC) 20 MG capsule Take 1 capsule (20 mg total) by mouth daily. Patient may resume home supply. (Patient not taking: Reported on 11/14/2017)  30 capsule 0   . traZODone (DESYREL) 50 MG tablet Take 1 tablet (50 mg total) by mouth at bedtime. 30 tablet 0     Musculoskeletal: Strength & Muscle Tone: within normal limits Gait & Station: normal Patient leans: N/A  Psychiatric Specialty Exam: Physical Exam  Constitutional: She is oriented to person, place, and time. She appears well-developed and well-nourished. No distress.  HENT:  Head: Normocephalic and atraumatic.  Right Ear: External ear normal.  Left Ear: External ear normal.  Eyes: Pupils are equal, round, and reactive to light. Right eye exhibits no discharge. Left eye exhibits no discharge.  Respiratory: Effort normal. No respiratory distress.  Musculoskeletal: Normal range of motion.  Neurological: She is alert and oriented to person, place, and time.  Skin: She is not diaphoretic.  Psychiatric: Her mood appears anxious. Thought content is not paranoid and not delusional. She exhibits a depressed mood. She expresses suicidal ideation. She expresses no homicidal ideation. She expresses no suicidal plans.    Review of Systems  Constitutional: Negative for chills, diaphoresis, fever, malaise/fatigue and weight loss.  Respiratory: Negative for cough and shortness of breath.   Cardiovascular: Negative for chest pain.  Gastrointestinal: Negative for diarrhea, nausea and vomiting.  Psychiatric/Behavioral: Positive for depression, hallucinations and suicidal ideas. Negative for memory loss and substance abuse. The patient is nervous/anxious and has insomnia.     Blood pressure 124/69, pulse 89, temperature 98.9 F (37.2 C), resp. rate 18, SpO2 98 %.There is no height or weight on file to calculate BMI.  General Appearance: Casual and Fairly Groomed  Eye Contact:  Fair  Speech:  Clear and Coherent and Normal Rate  Volume:  Decreased  Mood:  Anxious, Depressed, Hopeless and Worthless  Affect:  Congruent and Depressed  Thought Process:  Coherent, Goal Directed, Linear and  Descriptions of Associations: Intact  Orientation:  Full (Time, Place, and Person)  Thought Content:  Logical and Hallucinations: Auditory  Suicidal Thoughts:  Yes.  without intent/plan  Homicidal Thoughts:  No  Memory:  Immediate;   Good Recent;   Good  Judgement:  Intact  Insight:  Lacking  Psychomotor Activity:  Normal  Concentration:  Concentration: Fair and Attention Span: Fair  Recall:  Good  Fund of Knowledge:  Good  Language:  Good  Akathisia:  Negative  Handed:  Right  AIMS (if indicated):     Assets:  Communication Skills Desire for Improvement Financial Resources/Insurance Housing Leisure Time Physical Health  ADL's:  Intact  Cognition:  WNL  Sleep:         Treatment Plan Summary: Daily contact with patient to assess and evaluate symptoms and progress in treatment and Medication management  Observation Level/Precautions:  15 minute checks Laboratory:  CBC Chemistry Profile HbAIC HCG UDS Psychotherapy:  Individual Medications:   hydroxyzine 25 mg TID prn for anxiety Trazodone 50 mg QHS prn for sleep Consultations:  As needed Discharge Concerns:  safety Estimated LOS: Other:      Jackelyn Poling, NP 11/14/202010:14 PM

## 2019-05-21 ENCOUNTER — Encounter (HOSPITAL_COMMUNITY): Payer: Self-pay

## 2019-05-21 ENCOUNTER — Other Ambulatory Visit: Payer: Self-pay

## 2019-05-21 DIAGNOSIS — F251 Schizoaffective disorder, depressive type: Secondary | ICD-10-CM | POA: Diagnosis not present

## 2019-05-21 LAB — COMPREHENSIVE METABOLIC PANEL
ALT: 34 U/L (ref 0–44)
AST: 21 U/L (ref 15–41)
Albumin: 3.9 g/dL (ref 3.5–5.0)
Alkaline Phosphatase: 100 U/L (ref 38–126)
Anion gap: 7 (ref 5–15)
BUN: 9 mg/dL (ref 6–20)
CO2: 23 mmol/L (ref 22–32)
Calcium: 9.2 mg/dL (ref 8.9–10.3)
Chloride: 104 mmol/L (ref 98–111)
Creatinine, Ser: 0.64 mg/dL (ref 0.44–1.00)
GFR calc Af Amer: >60 mL/min (ref >60)
GFR calc non Af Amer: >60 mL/min (ref >60)
Glucose, Bld: 89 mg/dL (ref 70–99)
Potassium: 3.5 mmol/L (ref 3.5–5.1)
Sodium: 134 mmol/L — ABNORMAL LOW (ref 135–145)
Total Bilirubin: 0.5 mg/dL (ref 0.3–1.2)
Total Protein: 7 g/dL (ref 6.5–8.1)

## 2019-05-21 LAB — LIPID PANEL
Cholesterol: 121 mg/dL (ref 0–200)
HDL: 45 mg/dL (ref >40)
LDL Cholesterol: 60 mg/dL (ref 0–99)
Total CHOL/HDL Ratio: 2.7 RATIO
Triglycerides: 80 mg/dL (ref <150)
VLDL: 16 mg/dL (ref 0–40)

## 2019-05-21 LAB — SARS CORONAVIRUS 2 BY RT PCR (HOSPITAL ORDER, PERFORMED IN ~~LOC~~ HOSPITAL LAB): SARS Coronavirus 2: NEGATIVE

## 2019-05-21 LAB — CBC
HCT: 40.8 % (ref 36.0–46.0)
Hemoglobin: 13.7 g/dL (ref 12.0–15.0)
MCH: 29.1 pg (ref 26.0–34.0)
MCHC: 33.6 g/dL (ref 30.0–36.0)
MCV: 86.6 fL (ref 80.0–100.0)
Platelets: 344 10*3/uL (ref 150–400)
RBC: 4.71 MIL/uL (ref 3.87–5.11)
RDW: 12.5 % (ref 11.5–15.5)
WBC: 13.7 10*3/uL — ABNORMAL HIGH (ref 4.0–10.5)
nRBC: 0 % (ref 0.0–0.2)

## 2019-05-21 LAB — HEMOGLOBIN A1C
Hgb A1c MFr Bld: 5.2 % (ref 4.8–5.6)
Mean Plasma Glucose: 102.54 mg/dL

## 2019-05-21 LAB — HCG, QUANTITATIVE, PREGNANCY: hCG, Beta Chain, Quant, S: <1 m[IU]/mL (ref <5)

## 2019-05-21 LAB — TSH: TSH: 5.002 u[IU]/mL — ABNORMAL HIGH (ref 0.350–4.500)

## 2019-05-21 MED ORDER — TRAZODONE HCL 50 MG PO TABS
50.0000 mg | ORAL_TABLET | Freq: Every day | ORAL | Status: DC
  Administered 2019-05-21: 50 mg via ORAL
  Filled 2019-05-21: qty 1

## 2019-05-21 MED ORDER — ARIPIPRAZOLE 5 MG PO TABS
5.0000 mg | ORAL_TABLET | Freq: Every day | ORAL | Status: DC
  Administered 2019-05-22: 5 mg via ORAL
  Filled 2019-05-21: qty 1

## 2019-05-21 MED ORDER — ARIPIPRAZOLE 5 MG PO TABS
5.0000 mg | ORAL_TABLET | Freq: Once | ORAL | Status: AC
  Administered 2019-05-21: 5 mg via ORAL
  Filled 2019-05-21: qty 1

## 2019-05-21 NOTE — Progress Notes (Signed)
Patient ID: Kari Lowery, female   DOB: [birth year], 23 y.o.   MRN: [id number] Pt up and about on the unit: noted sitting in dayroom talking with staff. Pt is calm and cooperative with staff. Denies SI, and HI. Pt states that she hears voices but they are note loud-low tone. No attempts noted to harm self or others noted. Will continue to monitor q 15 mins for safety.

## 2019-05-21 NOTE — Progress Notes (Signed)
Patient ID: Kari Lowery, female   DOB: [birth year], 23 y.o.   MRN: [id number] Patient with blunted affect and depressed mood, denies SI/HI currently, and endorses +AH-states that she hears nonspecific voices all the time, and has been off her meds for a while now.  Pt is calm and cooperative, ate breakfast, is currently resting in bed with no signs of distress.  Will continue to monitor.

## 2019-05-21 NOTE — Progress Notes (Signed)
Patient ID: Johnathan Hausen, female   DOB: August 17, 1995, 23 y.o.   MRN: [id number] Pt presents to [facility] as a walk in. Pt states that she has been having Suicidal Thoughts with out a plan or intent. Pt also wants to get back on her medication and see a therapist. Pt states that she has a lot of stressors at this time in her life and time is hard. Pt lives with her grandmother at present; however her grandmother wants her to move out and get her own place to live. Pt is alert and oriented x 4. Denies SI and HI at this time. Pt states that she her voices all the time and sometimes become loud. Pt states that she sees things sometimes. Pt is guarded and evasive at times with her answers. Pt searched and skin assessment completed. No tattoos or scars noted. Pt contracted for safety. Will continue to monitor q 15 mins for safety. COVID-19 Test collected and sent to lab by security.

## 2019-05-21 NOTE — Plan of Care (Signed)
[facility]  Reason for Crisis Plan:  Crisis Stabilization   Plan of Care:  Referral for Inpatient Hospitalization  Family Support:      Current Living Environment:  Living Arrangements: Non-relatives/Friends  Insurance:   Hospital Account    Name Acct ID Class Status Primary Coverage   [place], Radke [id number] [place] - MEDICARE PART A AND B        Guarantor Account (for Hospital Account [id number])    Name Relation to Pt Service Area Active? Acct Type   Johnathan Hausen Self [facility] Yes Behavioral [facility]   Address Phone       [address], [place] [address] [phone])          Coverage Information (for Hospital Account [id number])    1. [place] PART A AND B    F/O Payor/Plan Precert #   MEDICARE/MEDICARE PART A AND B    Subscriber Subscriber #   Tameca, Jerez [id number]   Address Phone   [address] [place], [place] [address]        2. MEDICAID OUT OF STATE/[place]ICA[place] STATE [place]    F/O Payor/Plan Precert #   MEDICAID OUT OF STATE[place]    Subscriber Subscriber #   [place], Ohm [id number]   Address [place] [address] [phone]          Legal Guardian:     Primary Care Provider:  Patient, No Pcp Per  Current Outpatient Providers:  [facility]  Psychiatrist:     Counselor/Therapist:     Compliant with Medications:  No  Additional Information:   Charyl Dancer 11/15/202012:44 AM

## 2019-05-21 NOTE — Consult Note (Addendum)
[facility] Face-to-Face Psychiatry Consult   Reason for Consult:  Suicidal ideation Referring Physician:  Self Referred, Patient walked in Patient Identification: Kari Lowery MRN:  [id number] Principal Diagnosis: Schizoaffective disorder, depressive type (HCC) Diagnosis:  Principal Problem:   Schizoaffective disorder, depressive type (HCC) Active Problems:   PTSD (post-traumatic stress disorder)   Cluster B personality disorder (HCC)   Total Time spent with patient: 30 minutes  Subjective:   Kari Lowery is a 23 y.o. female patient admitted with "I have schizophrenia, and have been hearing voices lately."  Chart reviewed and received collateral information from nursing.  Patient seen and evaluated face to face with this Clinical research associate and Dr. Jannifer Franklin during mental health rounds.  The patient collaborates information previously received during [facility] admission assessment.  She endorse a long standing history for mental illness that includes schizophrenia.  Historically was managed with Abilify by mental health clinician in [place]. Recently relocated to [place] to help her aunt s/p surgery but due to housing concerns with roommates in [place], she decided to stay.  She currently stays with her grandmother but this is transitional and she is not sure how much longer she can stay there.  Her current housing insecurities and additional psychosocial stressors have contributed to mood instability and exacerbated her depressive symptoms. She is no longer taking antipsychotic medications and currently not connected with outpatient mental health resources due to insurance concerns.  She receives [employer] as her primary source of income but has not transferred her benefit to [facility].  When asked about suicidal thoughts/ideations/plan, she hesitated to answer the question.    Based on above, recommend restart medications for mood stability, and 24 observation to monitor for medication  efficacy and/or side effects.    Past Psychiatric History: schizophrenia  Risk to Self: Suicidal Ideation: Yes-Currently Present Suicidal Intent: No Is patient at risk for suicide?: Yes Suicidal Plan?: Yes-Currently Present Specify Current Suicidal Plan: pt expressed she has many ideas" Access to Means: No What has been your use of drugs/alcohol within the last 12 months?: none How many times?: 1 Other Self Harm Risks: off medications, stress, past SIB Triggers for Past Attempts: Unknown Intentional Self Injurious Behavior: (unknown) Risk to Others: Homicidal Ideation: No Thoughts of Harm to Others: No Current Homicidal Intent: No Current Homicidal Plan: No Access to Homicidal Means: No History of harm to others?: No Assessment of Violence: None Noted Does patient have access to weapons?: No Criminal Charges Pending?: No Does patient have a court date: No Prior Inpatient Therapy: Prior Inpatient Therapy: Yes Prior Therapy Dates: 2019 Prior Therapy Facilty/Provider(s): [facility] Reason for Treatment: Chales Abrahams) Prior Outpatient Therapy: Prior Outpatient Therapy: Yes Prior Therapy Facilty/Provider(s): (unknown) Does patient have an ACCT team?: No Does patient have Intensive In-House Services?  : No Does patient have [facility] services? : No Does patient have P4CC services?: No  Past Medical History:  Past Medical History:  Diagnosis Date  . Anxiety   . Depression   . Eating disorder   . Lymphadenitis, acute   . Medical history non-contributory   . Obesity   . Schizophrenia (HCC)   . URI, acute   . Vision abnormalities    History reviewed. No pertinent surgical history. Family History:  Family History  Problem Relation Age of Onset  . Bipolar disorder Mother   . Kidney disease Mother   . Cholelithiasis Paternal Grandmother   . Celiac disease Neg Hx    Family Psychiatric  History: unknwon Social History:  Social History  Substance and Sexual Activity  Alcohol Use No      Social History   Substance and Sexual Activity  Drug Use No    Social History   Socioeconomic History  . Marital status: Single    Spouse name: Not on file  . Number of children: 0  . Years of education: 8th grade  . Highest education level: Not on file  Occupational History  . Occupation: unemployed  Social Needs  . Financial resource strain: Not on file  . Food insecurity    Worry: Not on file    Inability: Not on file  . Transportation needs    Medical: Not on file    Non-medical: Not on file  Tobacco Use  . Smoking status: Never Smoker  . Smokeless tobacco: Never Used  Substance and Sexual Activity  . Alcohol use: No  . Drug use: No  . Sexual activity: Not Currently    Birth control/protection: None  Lifestyle  . Physical activity    Days per week: Not on file    Minutes per session: Not on file  . Stress: Not on file  Relationships  . Social Musicianconnections    Talks on phone: Not on file    Gets together: Not on file    Attends religious service: Not on file    Active member of club or organization: Not on file    Attends meetings of clubs or organizations: Not on file    Relationship status: Not on file  Other Topics Concern  . Not on file  Social History Narrative   9th grade 2014-2015   Additional Social History:    Allergies:  No Known Allergies  Labs:  Results for orders placed or performed during the hospital encounter of 05/20/19 (from the past 48 hour(s))  SARS Coronavirus 2 by RT PCR (hospital order, [facility]) Nasopharyngeal Nasopharyngeal Swab     Status: None   Collection Time: 05/20/19 10:25 PM   Specimen: Nasopharyngeal Swab  Result Value Ref Range   SARS Coronavirus 2 NEGATIVE NEGATIVE    Comment: (NOTE) If result is NEGATIVE SARS-CoV-2 target nucleic acids are NOT DETECTED. The SARS-CoV-2 RNA is generally detectable in upper and lower  respiratory specimens during the acute phase of infection. The  lowest  concentration of SARS-CoV-2 viral copies this assay can detect is 250  copies / mL. A negative result does not preclude SARS-CoV-2 infection  and should not be used as the sole basis for treatment or other  patient management decisions.  A negative result may occur with  improper specimen collection / handling, submission of specimen other  than nasopharyngeal swab, presence of viral mutation(s) within the  areas targeted by this assay, and inadequate number of viral copies  (<250 copies / mL). A negative result must be combined with clinical  observations, patient history, and epidemiological information. If result is POSITIVE SARS-CoV-2 target nucleic acids are DETECTED. The SARS-CoV-2 RNA is generally detectable in upper and lower  respiratory specimens dur ing the acute phase of infection.  Positive  results are indicative of active infection with SARS-CoV-2.  Clinical  correlation with patient history and other diagnostic information is  necessary to determine patient infection status.  Positive results do  not rule out bacterial infection or co-infection with other viruses. If result is PRESUMPTIVE POSTIVE SARS-CoV-2 nucleic acids MAY BE PRESENT.   A presumptive positive result was obtained on the submitted specimen  and confirmed on repeat testing.  While 2019 novel coronavirus  (SARS-CoV-2) nucleic acids may be present in the submitted sample  additional confirmatory testing may be necessary for epidemiological  and / or clinical management purposes  to differentiate between  SARS-CoV-2 and other Sarbecovirus currently known to infect humans.  If clinically indicated additional testing with an alternate test  methodology [phone]) is advised. The SARS-CoV-2 RNA is generally  detectable in upper and lower respiratory sp ecimens during the acute  phase of infection. The expected result is Negative. Fact Sheet for Patients:   StrictlyIdeas.no Fact Sheet for Healthcare Providers: BankingDealers.co.za This test is not yet approved or cleared by the [place] FDA and has been authorized for detection and/or diagnosis of SARS-CoV-2 by FDA under an Emergency Use Authorization (EUA).  This EUA will remain in effect (meaning this test can be used) for the duration of the COVID-19 declaration under Section 564(b)(1) of the Act, 21 U.S.C. section 360bbb-3(b)(1), unless the authorization is terminated or revoked sooner. Performed at [facility], [place] [address] [place], [place] [address]   CBC     Status: Abnormal   Collection Time: 05/21/19  6:30 AM  Result Value Ref Range   WBC 13.7 (H) 4.0 - 10.5 K/uL   RBC 4.71 3.87 - 5.11 MIL/uL   Hemoglobin 13.7 12.0 - 15.0 g/dL   HCT 40.8 36.0 - 46.0 %   MCV 86.6 80.0 - 100.0 fL   MCH 29.1 26.0 - 34.0 pg   MCHC 33.6 30.0 - 36.0 g/dL   RDW 12.5 11.5 - 15.5 %   Platelets 344 150 - 400 K/uL   nRBC 0.0 0.0 - 0.2 %    Comment: Performed at [facility], [place] [address], [place], [place] [address]  Comprehensive metabolic panel     Status: Abnormal   Collection Time: 05/21/19  6:30 AM  Result Value Ref Range   Sodium 134 (L) 135 - 145 mmol/L   Potassium 3.5 3.5 - 5.1 mmol/L   Chloride 104 98 - 111 mmol/L   CO2 23 22 - 32 mmol/L   Glucose, Bld 89 70 - 99 mg/dL   BUN 9 6 - 20 mg/dL   Creatinine, Ser 0.64 0.44 - 1.00 mg/dL   Calcium 9.2 8.9 - 10.3 mg/dL   Total Protein 7.0 6.5 - 8.1 g/dL   Albumin 3.9 3.5 - 5.0 g/dL   AST 21 15 - 41 U/L   ALT 34 0 - 44 U/L   Alkaline Phosphatase 100 38 - 126 U/L   Total Bilirubin 0.5 0.3 - 1.2 mg/dL   GFR calc non Af Amer >60 >60 mL/min   GFR calc Af Amer >60 >60 mL/min   Anion gap 7 5 - 15    Comment: Performed at [facility], [place] [address], [place], [place] [address]  Hemoglobin A1c     Status: None   Collection Time:  05/21/19  6:30 AM  Result Value Ref Range   Hgb A1c MFr Bld 5.2 4.8 - 5.6 %    Comment: (NOTE) Pre diabetes:          5.7%-6.4% Diabetes:              >6.4% Glycemic control for   <7.0% adults with diabetes    Mean Plasma Glucose 102.54 mg/dL    Comment: Performed at [facility] [address] [place], [place] [address]  Lipid panel     Status: None   Collection Time: 05/21/19  6:30 AM  Result Value Ref Range   Cholesterol 121 0 - 200 mg/dL   Triglycerides 80 <992 mg/dL   HDL 45 >42 mg/dL   Total CHOL/HDL Ratio 2.7 RATIO   VLDL 16 0 - 40 mg/dL   LDL Cholesterol 60 0 - 99 mg/dL    Comment:        Total Cholesterol/HDL:CHD Risk Coronary Heart Disease Risk Table                     Men   Women  1/2 Average Risk   3.4   3.3  Average Risk       5.0   4.4  2 X Average Risk   9.6   7.1  3 X Average Risk  23.4   11.0        Use the calculated Patient Ratio above and the CHD Risk Table to determine the patient's CHD Risk.        ATP III CLASSIFICATION (LDL):  <100     mg/dL   Optimal  683-419  mg/dL   Near or Above                    Optimal  130-159  mg/dL   Borderline  622-297  mg/dL   High  >989     mg/dL   Very High Performed at [facility], [address], [place], [place] [address]   TSH     Status: Abnormal   Collection Time: 05/21/19  6:30 AM  Result Value Ref Range   TSH 5.002 (H) 0.350 - 4.500 uIU/mL    Comment: Performed by a 3rd Generation assay with a functional sensitivity of <=0.01 uIU/mL. Performed at [facility], [address], [place], [place] [address]     Current Facility-Administered Medications  Medication Dose Route Frequency Provider Last Rate Last Dose  . acetaminophen (TYLENOL) tablet 650 mg  650 mg Oral Q6H PRN Jackelyn Poling, NP      . alum & mag hydroxide-simeth (MAALOX/MYLANTA) 200-200-20 MG/5ML suspension 30 mL  30 mL Oral Q4H PRN Jackelyn Poling, NP      . Melene Muller ON 05/22/2019] ARIPiprazole  (ABILIFY) tablet 5 mg  5 mg Oral Daily Trevino Wyatt, MD      . hydrOXYzine (ATARAX/VISTARIL) tablet 25 mg  25 mg Oral TID PRN Nira Conn A, NP      . magnesium hydroxide (MILK OF MAGNESIA) suspension 30 mL  30 mL Oral Daily PRN Nira Conn A, NP      . traZODone (DESYREL) tablet 50 mg  50 mg Oral QHS PRN Nira Conn A, NP      . traZODone (DESYREL) tablet 50 mg  50 mg Oral QHS Chales Abrahams, NP        Musculoskeletal: Strength & Muscle Tone: within normal limits Gait & Station: normal Patient leans: N/A  Psychiatric Specialty Exam: Physical Exam  Constitutional: She is oriented to person, place, and time. She appears well-developed.  Neck: Normal range of motion.  Cardiovascular: Normal rate.  Respiratory: Effort normal.  Musculoskeletal: Normal range of motion.  Neurological: She is alert and oriented to person, place, and time.  Psychiatric: Thought content normal.    Review of Systems  Psychiatric/Behavioral: Positive for depression, hallucinations (endorses audible) and suicidal ideas. The patient is nervous/anxious.     Blood pressure 134/75, pulse 88, temperature 98.9 F (37.2 C), temperature source Oral, resp. rate 18, last  menstrual period 04/30/2019, SpO2 98 %.There is no height or weight on file to calculate BMI.  General Appearance: Casual and Fairly Groomed  Eye Contact:  Good  Speech:  Clear and Coherent and Normal Rate  Volume:  Normal  Mood:  Depressed  Affect:  Congruent  Thought Process:  Coherent and Descriptions of Associations: Circumstantial  Orientation:  Full (Time, Place, and Person)  Thought Content:  Logical and Hallucinations: Auditory  Suicidal Thoughts:  Yes.  without intent/plan  Homicidal Thoughts:  No  Memory:  Immediate;   Good Recent;   Good Remote;   Good  Judgement:  Impaired  Insight:  Lacking  Psychomotor Activity:  Normal  Concentration:  Concentration: Fair and Attention Span: Fair  Recall:  Good  Fund of Knowledge:  Good   Language:  Good  Akathisia:  Negative  Handed:  Right  AIMS (if indicated):     Assets:  Communication Skills Desire for Improvement Physical Health Resilience  ADL's:  Intact  Cognition:  WNL  Sleep:   <6 hours   Treatment Plan Summary: Start the following medications:  Schizophrenia: Aripiprazole  po daily  Anxiety: Hydroxyzine  po TID prn   Sleep Disturbances: Trazodone  po qhs  -Daily contact with patient to assess and evaluate symptoms and progress in treatment, Medication management and Plan start on medications to target depressive symptoms and audible hallucinations.  -Recommend 24 hour observations and can reassess in the am.  - Also patient endorse homelessess, would like her to see SW for assistance with community resources prior to discharge.  - Above was discussed with the patient who agrees.   Disposition: Patient does not meet criteria for psychiatric inpatient admission. But recommend 24 obs as outlined above.   Chales Abrahams, NP 05/21/2019 5:30 PM   Patient seen face-to-face for psychiatric evaluation, chart reviewed and case discussed with the physician extender and developed treatment plan. Reviewed the information documented and agree with the treatment plan. Thedore Mins, MD

## 2019-05-22 ENCOUNTER — Encounter (HOSPITAL_COMMUNITY): Payer: Self-pay | Admitting: Registered Nurse

## 2019-05-22 DIAGNOSIS — F6089 Other specific personality disorders: Secondary | ICD-10-CM

## 2019-05-22 DIAGNOSIS — F4310 Post-traumatic stress disorder, unspecified: Secondary | ICD-10-CM

## 2019-05-22 DIAGNOSIS — F251 Schizoaffective disorder, depressive type: Secondary | ICD-10-CM | POA: Diagnosis not present

## 2019-05-22 MED ORDER — ARIPIPRAZOLE 5 MG PO TABS: 5.0000 mg | ORAL_TABLET | Freq: Every day | ORAL | Status: AC

## 2019-05-22 MED ORDER — TRAZODONE HCL 50 MG PO TABS: 50.0000 mg | ORAL_TABLET | Freq: Every day | ORAL | 0 refills | Status: AC

## 2019-05-22 MED ORDER — HYDROXYZINE HCL 25 MG PO TABS: 25.0000 mg | ORAL_TABLET | Freq: Three times a day (TID) | ORAL | 0 refills | Status: AC | PRN

## 2019-05-22 NOTE — Progress Notes (Signed)
D: Pt A & O X 4. Denies SI, HI, AVH and pain at this time. D/C home as ordered. Picked up in front of facility by a Lyft driver. A: D/C instructions reviewed with pt including prescriptions and follow up care with [facility] in [place]; compliance encouraged. All belongings from locker # 43 returned to pt at time of departure. Scheduled medication given with verbal education and effects monitored. Safety checks maintained without incident till time of d/c.  R: Pt receptive to care. Compliant with medications when offered. Denies adverse drug reactions when assessed. Verbalized understanding related to d/c instructions. Signed belonging sheet in agreement with items received from locker. Ambulatory with a steady gait. Appears to be in no physical distress at time of departure.

## 2019-05-22 NOTE — Discharge Instructions (Signed)
For your behavioral health needs, you are advised to follow up with [facility]:       [facility]      [address]      [place], [place] [address]      [phone]

## 2019-05-22 NOTE — BH Assessment (Signed)
[facility] Assessment Progress Note  Per Shuvon Rankin, FNP, this pt does not require psychiatric hospitalization at this time.  Pt is to be discharged from the [facility] Observ[facility] Unit with recommendation to follow up with [facility] in [place] in pt's county of residence.  This has been included in pt's discharge instructions.  Pt's nurse, Nicoletta Dress, has been notified.  Jalene Mullet, [place] Triage Specialist [phone]

## 2019-05-22 NOTE — Discharge Summary (Signed)
[facility]  05/22/2019 1:48 PM Kari Lowery  MRN:  [id number] Principal Problem: Schizoaffective disorder, depressive type [facility](HCC) Discharge Diagnoses: Principal Problem:   Schizoaffective disorder, depressive type (HCC) Active Problems:   PTSD (post-traumatic stress disorder)   Cluster B personality disorder (HCC)   Subjective: Kari Lowery, 23 y.o., female patient seen via tele psych by this provider, Dr. Lucianne MussKumar; and chart reviewed on 05/22/19.  On evaluation Kari Lowery reports that [place] few months ago and she has ran out of her medication since she has been here.  Patient reports has been off her medications for 2 weeks and has not had a chance to set up outpatient psychiatric services since she is moved.  Patient denies suicidal/self-harm/homicidal ideations, psychosis, paranoia.  Patient reports that she is living with family and that she is on SSI.  States she has not had a chance to switch her Medicaid over to [place] but does do some things that her family is working on.  During evaluation Kari Lowery is alert/oriented x 4; calm/cooperative; and mood is congruent with affect.  She does not appear to be responding to internal/external stimuli or delusional thoughts.  Patient denies suicidal/self-harm/homicidal ideation, psychosis, and paranoia.  Patient answered question appropriately.  Patient states she would meet assistance with transportation to get back home.   Total Time spent with patient: 30 minutes  Past Psychiatric History: Bipolar disorder, PTSD, schizoaffective, cluster B personality disorder  Past Medical History:  Past Medical History:  Diagnosis Date  . Anxiety   . Depression   . Eating disorder   . Lymphadenitis, acute   . Medical history non-contributory   . Obesity   . Schizophrenia (HCC)   . URI, acute   . Vision abnormalities    History  reviewed. No pertinent surgical history. Family History:  Family History  Problem Relation Age of Onset  . Bipolar disorder Mother   . Kidney disease Mother   . Cholelithiasis Paternal Grandmother   . Celiac disease Neg Hx    Family Psychiatric  History: Unaware Social History:  Social History   Substance and Sexual Activity  Alcohol Use No     Social History   Substance and Sexual Activity  Drug Use No    Social History   Socioeconomic History  . Marital status: Single    Spouse name: Not on file  . Number of children: 0  . Years of education: 8th grade  . Highest education level: Not on file  Occupational History  . Occupation: unemployed  Social Needs  . Financial resource strain: Not on file  . Food insecurity    Worry: Not on file    Inability: Not on file  . Transportation needs    Medical: Not on file    Non-medical: Not on file  Tobacco Use  . Smoking status: Never Smoker  . Smokeless tobacco: Never Used  Substance and Sexual Activity  . Alcohol use: No  . Drug use: No  . Sexual activity: Not Currently    Birth control/protection: None  Lifestyle  . Physical activity    Days per week: Not on file    Minutes per session: Not on file  . Stress: Not on file  Relationships  . Social Musicianconnections    Talks on phone: Not on file    Gets together: Not on file    Attends religious service: Not on file  Active member of club or organization: Not on file    Attends meetings of clubs or organizations: Not on file    Relationship status: Not on file  Other Topics Concern  . Not on file  Social History Narrative   9th grade 2014-2015    Has this patient used any form of tobacco in the last 30 days? (Cigarettes, Smokeless Tobacco, Cigars, and/or Pipes) Prescription not provided because: Patient does not use tobacco products  Current Medications: Current Facility-Administered Medications  Medication Dose Route Frequency Provider Last Rate Last Dose  .  acetaminophen (TYLENOL) tablet 650 mg  650 mg Oral Q6H PRN Lindon Romp A, NP      . alum & mag hydroxide-simeth (MAALOX/MYLANTA) 200-200-20 MG/5ML suspension 30 mL  30 mL Oral Q4H PRN Lindon Romp A, NP      . ARIPiprazole (ABILIFY) tablet 5 mg  5 mg Oral Daily Akintayo, Mojeed, MD   5 mg at 05/22/19 3716  . hydrOXYzine (ATARAX/VISTARIL) tablet 25 mg  25 mg Oral TID PRN Rozetta Nunnery, NP   25 mg at 05/22/19 0136  . magnesium hydroxide (MILK OF MAGNESIA) suspension 30 mL  30 mL Oral Daily PRN Lindon Romp A, NP      . traZODone (DESYREL) tablet 50 mg  50 mg Oral QHS Merlyn Lot E, NP   50 mg at 05/21/19 2116   PTA Medications: Medications Prior to Admission  Medication Sig Dispense Refill Last Dose  . acetaminophen (TYLENOL) 325 MG tablet Take 650 mg by mouth every 6 (six) hours as needed for mild pain or headache.     . ARIPiprazole (ABILIFY) 20 MG tablet Take 1 tablet (20 mg total) by mouth daily. (Patient not taking: Reported on 02/09/2018) 30 tablet 0 Not Taking at Unknown time  . ARIPiprazole (ABILIFY) 5 MG tablet Take 1 tablet (5 mg total) by mouth daily. (Patient not taking: Reported on 02/09/2018) 14 tablet 0 Not Taking at Unknown time  . ARIPiprazole ER (ABILIFY MAINTENA) 400 MG PRSY prefilled syringe Inject 400 mg into the muscle every 28 (twenty-eight) days.   Not Taking at Unknown time  . hydrOXYzine (ATARAX/VISTARIL) 25 MG tablet Take 1 tablet (25 mg total) by mouth 3 (three) times daily. (Patient not taking: Reported on 01/12/2018) 30 tablet 0 Not Taking at Unknown time  . omeprazole (PRILOSEC) 20 MG capsule Take 1 capsule (20 mg total) by mouth daily. Patient may resume home supply. (Patient not taking: Reported on 11/14/2017) 30 capsule 0 Not Taking at Unknown time  . [DISCONTINUED] traZODone (DESYREL) 50 MG tablet Take 1 tablet (50 mg total) by mouth at bedtime. (Patient not taking: Reported on 05/21/2019) 30 tablet 0 Not Taking at Unknown time    Musculoskeletal: Strength & Muscle  Tone: within normal limits Gait & Station: normal Patient leans: N/A  Psychiatric Specialty Exam: Physical Exam  Nursing note and vitals reviewed. Constitutional: She is oriented to person, place, and time. She appears well-developed and well-nourished. No distress.  Neck: Normal range of motion.  Respiratory: Effort normal.  Musculoskeletal: Normal range of motion.  Neurological: She is alert and oriented to person, place, and time.  Skin: Skin is warm and dry.  Psychiatric: She has a normal mood and affect. Her behavior is normal. Judgment and thought content normal.    Review of Systems  Psychiatric/Behavioral: Depression:   Hallucinations:  Denies. Substance abuse:  Denies. Suicidal ideas:  Denies. Nervous/anxious:  Stable. Insomnia:  Denies.   All other systems reviewed and  are negative.   Blood pressure 134/75, pulse 88, temperature 98.9 F (37.2 C), temperature source Oral, resp. rate 18, last menstrual period 04/30/2019, SpO2 98 %.There is no height or weight on file to calculate BMI.  General Appearance: Casual  Eye Contact:  Good  Speech:  Clear and Coherent and Normal Rate  Volume:  Normal  Mood:  " Good."  Appropriate  Affect:  Appropriate and Congruent  Thought Process:  Coherent, Goal Directed and Descriptions of Associations: Intact  Orientation:  Full (Time, Place, and Person)  Thought Content:  WDL  Suicidal Thoughts:  No  Homicidal Thoughts:  No  Memory:  Immediate;   Good Recent;   Good  Judgement:  Intact  Insight:  Present  Psychomotor Activity:  Normal  Concentration:  Concentration: Good and Attention Span: Good  Recall:  Good  Fund of Knowledge:  Good  Language:  Good  Akathisia:  No  Handed:  Right  AIMS (if indicated):     Assets:  Communication Skills Desire for Improvement Financial Resources/Insurance Housing Social Support  ADL's:  Intact  Cognition:  WNL  Sleep:        Demographic Factors:  Caucasian  Loss  Factors: NA  Historical Factors: NA  Risk Reduction Factors:   Religious beliefs about death, Living with another person, especially a relative and Positive social support  Continued Clinical Symptoms:  Previous Psychiatric Diagnoses and Treatments  Cognitive Features That Contribute To Risk:  None    Suicide Risk:  Minimal: No identifiable suicidal ideation.  Patients presenting with no risk factors but with morbid ruminations; may be classified as minimal risk based on the severity of the depressive symptoms    Plan Of Care/Follow-up recommendations:  Activity:  As tolerated Diet:  Heart healthy Other:  Follow-up with resources given for outpatient psychiatric services   Discharge Instructions     For your behavioral health needs, you are advised to follow up with [facility]:       [facility]      [address]      [place], [place] [address]      [phone]     Disposition: No evidence of imminent risk to self or others at present.   Patient does not meet criteria for psychiatric inpatient admission. Supportive therapy provided about ongoing stressors. Discussed crisis plan, support from social network, calling 911, coming to the Emergency Department, and calling Suicide Hotline.  Stephane Junkins, NP 05/22/2019, 1:48 PM

## 2019-05-28 ENCOUNTER — Other Ambulatory Visit: Payer: Self-pay

## 2019-05-28 ENCOUNTER — Emergency Department: Payer: Medicare Other

## 2019-05-28 ENCOUNTER — Emergency Department
Admission: EM | Admit: 2019-05-28 | Discharge: 2019-05-28 | Disposition: A | Discharge status: Home / Self Care | Payer: Medicare Other | Attending: Emergency Medicine | Admitting: Emergency Medicine

## 2019-05-28 ENCOUNTER — Encounter: Payer: Self-pay | Admitting: Emergency Medicine

## 2019-05-28 DIAGNOSIS — E860 Dehydration: Secondary | ICD-10-CM

## 2019-05-28 DIAGNOSIS — R251 Tremor, unspecified: Secondary | ICD-10-CM | POA: Diagnosis present

## 2019-05-28 DIAGNOSIS — G249 Dystonia, unspecified: Secondary | ICD-10-CM | POA: Diagnosis not present

## 2019-05-28 LAB — URINALYSIS, COMPLETE (UACMP) WITH MICROSCOPIC
Bilirubin Urine: NEGATIVE
Glucose, UA: NEGATIVE mg/dL
Ketones, ur: 80 mg/dL — AB
Leukocytes,Ua: NEGATIVE
Nitrite: NEGATIVE
Protein, ur: NEGATIVE mg/dL
Specific Gravity, Urine: 1.019 (ref 1.005–1.030)
pH: 6 (ref 5.0–8.0)

## 2019-05-28 LAB — URINE DRUG SCREEN, QUALITATIVE (ARMC ONLY)
Amphetamines, Ur Screen: NOT DETECTED
Barbiturates, Ur Screen: NOT DETECTED
Benzodiazepine, Ur Scrn: NOT DETECTED
Cannabinoid 50 Ng, Ur ~~LOC~~: NOT DETECTED
Cocaine Metabolite,Ur ~~LOC~~: NOT DETECTED
MDMA (Ecstasy)Ur Screen: NOT DETECTED
Methadone Scn, Ur: NOT DETECTED
Opiate, Ur Screen: NOT DETECTED
Phencyclidine (PCP) Ur S: NOT DETECTED
Tricyclic, Ur Screen: NOT DETECTED

## 2019-05-28 LAB — CBC WITH DIFFERENTIAL/PLATELET
Abs Immature Granulocytes: 0.05 K/uL (ref 0.00–0.07)
Basophils Absolute: 0 K/uL (ref 0.0–0.1)
Basophils Relative: 0 %
Eosinophils Absolute: 0.2 K/uL (ref 0.0–0.5)
Eosinophils Relative: 2 %
HCT: 41 % (ref 36.0–46.0)
Hemoglobin: 14.2 g/dL (ref 12.0–15.0)
Immature Granulocytes: 1 %
Lymphocytes Relative: 14 %
Lymphs Abs: 1.5 K/uL (ref 0.7–4.0)
MCH: 28.2 pg (ref 26.0–34.0)
MCHC: 34.6 g/dL (ref 30.0–36.0)
MCV: 81.5 fL (ref 80.0–100.0)
Monocytes Absolute: 0.7 K/uL (ref 0.1–1.0)
Monocytes Relative: 6 %
Neutro Abs: 8.7 K/uL — ABNORMAL HIGH (ref 1.7–7.7)
Neutrophils Relative %: 77 %
Platelets: 350 K/uL (ref 150–400)
RBC: 5.03 MIL/uL (ref 3.87–5.11)
RDW: 12.4 % (ref 11.5–15.5)
WBC: 11.1 K/uL — ABNORMAL HIGH (ref 4.0–10.5)
nRBC: 0 % (ref 0.0–0.2)

## 2019-05-28 LAB — COMPREHENSIVE METABOLIC PANEL WITH GFR
ALT: 42 U/L (ref 0–44)
AST: 29 U/L (ref 15–41)
Albumin: 4.6 g/dL (ref 3.5–5.0)
Alkaline Phosphatase: 100 U/L (ref 38–126)
Anion gap: 13 (ref 5–15)
BUN: 7 mg/dL (ref 6–20)
CO2: 22 mmol/L (ref 22–32)
Calcium: 9.6 mg/dL (ref 8.9–10.3)
Chloride: 104 mmol/L (ref 98–111)
Creatinine, Ser: 0.67 mg/dL (ref 0.44–1.00)
GFR calc Af Amer: >60 mL/min
GFR calc non Af Amer: >60 mL/min
Glucose, Bld: 90 mg/dL (ref 70–99)
Potassium: 3.7 mmol/L (ref 3.5–5.1)
Sodium: 139 mmol/L (ref 135–145)
Total Bilirubin: 0.5 mg/dL (ref 0.3–1.2)
Total Protein: 8 g/dL (ref 6.5–8.1)

## 2019-05-28 LAB — POCT PREGNANCY, URINE: Preg Test, Ur: NEGATIVE

## 2019-05-28 LAB — ETHANOL: Alcohol, Ethyl (B): <10 mg/dL

## 2019-05-28 LAB — SALICYLATE LEVEL: Salicylate Lvl: <7 mg/dL (ref 2.8–30.0)

## 2019-05-28 LAB — ACETAMINOPHEN LEVEL: Acetaminophen (Tylenol), Serum: <10 ug/mL — ABNORMAL LOW (ref 10–30)

## 2019-05-28 MED ORDER — LORAZEPAM 2 MG/ML IJ SOLN
1.0000 mg | Freq: Once | INTRAMUSCULAR | Status: AC
  Administered 2019-05-28: 1 mg via INTRAVENOUS
  Filled 2019-05-28: qty 1

## 2019-05-28 MED ORDER — SODIUM CHLORIDE 0.9 % IV BOLUS
1000.0000 mL | Freq: Once | INTRAVENOUS | Status: AC
  Administered 2019-05-28: 1000 mL via INTRAVENOUS

## 2019-05-28 NOTE — ED Triage Notes (Signed)
Patient here with complaint of muscle twitching in right leg and left eye and confusion that started about an hour ago. Patient with stuttering and aphasia in triage.

## 2019-05-28 NOTE — ED Provider Notes (Signed)
[facility] Emergency Department Provider Note   ____________________________________________   First MD Initiated Contact with Patient 05/28/19 [id number]     (approximate)  I have reviewed the triage vital signs and the nursing notes.   HISTORY  Chief Complaint Tremors and Aphasia    HPI Kari Lowery is a 23 y.o. female who presents to the ED from home with a chief complaint of right leg twitching, left eye twitching, stuttering and disorientation.  Patient has a history of schizophrenia who had a recent behavioral health admission from 11/14-11/16.  States Kari Lowery had not been taking her Abilify, trazodone or hydroxyzine for the past several months because Kari Lowery moved from [place].  [facility], Kari Lowery was restarted on these medicines and discharged home with prescriptions which Kari Lowery did not fill.  Has not been sleeping recently.  States Kari Lowery was still awake tonight when Kari Lowery felt her right leg started twitching followed by left eye twitching.  Kari Lowery panicked and noted disorientation and stutter.  Denies recent fall/head injury.  Denies fever, cough, chest pain, shortness of breath, abdominal pain, nausea or vomiting.  Denies active SI/HI/AH/VH.       Past Medical History:  Diagnosis Date  . Anxiety   . Depression   . Eating disorder   . Lymphadenitis, acute   . Medical history non-contributory   . Obesity   . Schizophrenia (HCC)   . URI, acute   . Vision abnormalities     Patient Active Problem List   Diagnosis Date Noted  . Schizoaffective disorder, depressive type (HCC) 05/20/2019  . Acute pancreatitis 01/16/2015  . Post traumatic stress disorder 10/03/2013  . Cluster B personality disorder (HCC) 10/03/2013  . Oppositional defiant behavior 10/03/2013  . Suicide attempt by adequate means (HCC) 10/03/2013  . Bipolar I disorder, most recent episode depressed (HCC) 10/02/2013  . PTSD (post-traumatic stress disorder) 07/18/2013  . ODD  (oppositional defiant disorder) 07/18/2013  . 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 Generalized abdominal pain 07/04/2013  . Diarrhea   . Vomiting     History reviewed. No pertinent surgical history.  Prior to Admission medications   Medication Sig Start Date End Date Taking? Authorizing Provider  acetaminophen (TYLENOL) 325 MG tablet Take 650 mg by mouth every 6 (six) hours as needed for mild pain or headache.    [provider]  ARIPiprazole (ABILIFY) 5 MG tablet Take 1 tablet (5 mg total) by mouth daily. 05/23/19   Rankin, Shuvon B, NP  ARIPiprazole ER (ABILIFY MAINTENA) 400 MG PRSY prefilled syringe Inject 400 mg into the muscle every 28 (twenty-eight) days.    [provider]  hydrOXYzine (ATARAX/VISTARIL) 25 MG tablet Take 1 tablet (25 mg total) by mouth 3 (three) times daily as needed for anxiety. 05/22/19   Rankin, Shuvon B, NP  omeprazole (PRILOSEC) 20 MG capsule Take 1 capsule (20 mg total) by mouth daily. Patient may resume home supply. Patient not taking: Reported on 11/14/2017 01/17/15   Alford HighlandWieting, Richard, MD  traZODone (DESYREL) 50 MG tablet Take 1 tablet (50 mg total) by mouth at bedtime. 05/22/19   Rankin, Shuvon B, NP    Allergies Patient has no known allergies.  Family History  Problem Relation Age of Onset  . Bipolar disorder Mother   . Kidney disease Mother   . Cholelithiasis Paternal Grandmother   . Celiac disease Neg Hx     Social History Social History   Tobacco Use  .  Smoking status: Never Smoker  . Smokeless tobacco: Never Used  Substance Use Topics  . Alcohol use: No  . Drug use: No    Review of Systems  Constitutional: No fever/chills Eyes: No visual changes. ENT: No sore throat. Cardiovascular: Denies chest pain. Respiratory: Denies shortness of breath. Gastrointestinal: No abdominal pain.  No nausea, no vomiting.  No diarrhea.  No constipation. Genitourinary: Negative for  dysuria. Musculoskeletal: Positive for muscle twitching.  Negative for back pain. Skin: Negative for rash. Neurological: Positive for altered mentation.  Negative for headaches, focal weakness or numbness. Psychiatric:  Positive for anxiety.  ____________________________________________   PHYSICAL EXAM:  VITAL SIGNS: ED Triage Vitals  Enc Vitals Group     BP 05/28/19 0456 132/84     Pulse Rate 05/28/19 0456 (!) 115     Resp 05/28/19 0456 18     Temp 05/28/19 0456 98.7 F (37.1 C)     Temp Source 05/28/19 0456 Oral     SpO2 05/28/19 0456 100 %     Weight 05/28/19 0457 200 lb (90.7 kg)     Height 05/28/19 0457 5\' 4"  (1.626 m)     Head Circumference --      Peak Flow --      Pain Score 05/28/19 0457 0     Pain Loc --      Pain Edu? --      Excl. in GC? --     Constitutional: Alert and oriented. Well appearing and in mild acute distress. Eyes: Conjunctivae are normal. PERRL. EOMI. No twitch noted to left eye. Head: Atraumatic. Nose: No congestion/rhinnorhea. Mouth/Throat: Mucous membranes are moist.  Oropharynx non-erythematous. Neck: No stridor.  Supple neck without meningismus. Cardiovascular: Normal rate, regular rhythm. Grossly normal heart sounds.  Good peripheral circulation. Respiratory: Normal respiratory effort.  No retractions. Lungs CTAB. Gastrointestinal: Soft and nontender. No distention. No abdominal bruits. No CVA tenderness. Musculoskeletal: Occasional right leg twitching.  No lower extremity tenderness nor edema.  No joint effusions. Neurologic: Alert and oriented x3.  CN II-XII grossly intact.  Pressured speech and language.  Mild stuttering.  No aphasia.  No dysarthria.  No gross focal neurologic deficits are appreciated.  5/5 motor strength and sensation.  MAEx4.  Skin:  Skin is warm, dry and intact. No rash noted. Psychiatric: Mood and affect are anxious. Speech and behavior are rapid.  ____________________________________________   LABS (all labs  ordered are listed, but only abnormal results are displayed)  Labs Reviewed  CBC WITH DIFFERENTIAL/PLATELET - Abnormal; Notable for the following components:      Result Value   WBC 11.1 (*)    Neutro Abs 8.7 (*)    All other components within normal limits  ACETAMINOPHEN LEVEL - Abnormal; Notable for the following components:   Acetaminophen (Tylenol), Serum <10 (*)    All other components within normal limits  URINALYSIS, COMPLETE (UACMP) WITH MICROSCOPIC - Abnormal; Notable for the following components:   Color, Urine YELLOW (*)    APPearance CLEAR (*)    Hgb urine dipstick SMALL (*)    Ketones, ur 80 (*)    Bacteria, UA RARE (*)    All other components within normal limits  COMPREHENSIVE METABOLIC PANEL  ETHANOL  SALICYLATE LEVEL  URINE DRUG SCREEN, QUALITATIVE ([facility] ONLY)  POC URINE PREG, ED  POCT PREGNANCY, URINE   ____________________________________________  EKG  ED ECG REPORT I, Zyonna Vardaman J, the attending physician, personally viewed and interpreted this ECG.   Date: 05/28/2019  EKG Time:  5284  Rate: 95  Rhythm: normal EKG, normal sinus rhythm  Axis: Normal  Intervals:none  ST&T Change: Nonspecific  ____________________________________________  RADIOLOGY  ED MD interpretation: No ICH  Official radiology report(s): Ct Head Wo Contrast  Result Date: 05/28/2019 CLINICAL DATA:  Initial evaluation for acute confusion, muscle twitching in right leg and left eye. EXAM: CT HEAD WITHOUT CONTRAST TECHNIQUE: Contiguous axial images were obtained from the base of the skull through the vertex without intravenous contrast. COMPARISON:  Prior head CT from 10/02/2013. FINDINGS: Brain: Cerebral volume within normal limits for patient age. No evidence for acute intracranial hemorrhage. No findings to suggest acute large vessel territory infarct. No mass lesion, midline shift, or mass effect. Ventricles are normal in size without evidence for hydrocephalus. No extra-axial  fluid collection identified. Vascular: No hyperdense vessel identified. Skull: Scalp soft tissues demonstrate no acute abnormality. Calvarium intact. Sinuses/Orbits: Globes and orbital soft tissues within normal limits. Visualized paranasal sinuses are clear. No mastoid effusion. IMPRESSION: Normal head CT.  No acute intracranial abnormality identified. Electronically Signed   By: Rise Mu M.D.   On: 05/28/2019 05:58    ____________________________________________   PROCEDURES  Procedure(s) performed (including Critical Care):  Procedures  NIH Stroke Scale  Interval: Baseline Time: 6:56 AM Person Administering Scale: Marianny Goris J  Administer stroke scale items in the order listed. Record performance in each category after each subscale exam. Do not go back and change scores. Follow directions provided for each exam technique. Scores should reflect what the patient does, not what the clinician thinks the patient can do. The clinician should record answers while administering the exam and work quickly. Except where indicated, the patient should not be coached (i.e., repeated requests to patient to make a special effort).   1a  Level of consciousness: 0=alert; keenly responsive  1b. LOC questions:  0=Performs both tasks correctly  1c. LOC commands: 0=Performs both tasks correctly  2.  Best Gaze: 0=normal  3.  Visual: 0=No visual loss  4. Facial Palsy: 0=Normal symmetric movement  5a.  Motor left arm: 0=No drift, limb holds 90 (or 45) degrees for full 10 seconds  5b.  Motor right arm: 0=No drift, limb holds 90 (or 45) degrees for full 10 seconds  6a. motor left leg: 0=No drift, limb holds 90 (or 45) degrees for full 10 seconds  6b  Motor right leg:  0=No drift, limb holds 90 (or 45) degrees for full 10 seconds  7. Limb Ataxia: 0=Absent  8.  Sensory: 0=Normal; no sensory loss  9. Best Language:  0=No aphasia, normal  10. Dysarthria: 0=Normal  11. Extinction and Inattention:  0=No abnormality  12. Distal motor function: 0=Normal   Total:   0    ____________________________________________   INITIAL IMPRESSION / ASSESSMENT AND PLAN / ED COURSE  As part of my medical decision making, I reviewed the following data within the electronic MEDICAL RECORD NUMBER Nursing notes reviewed and incorporated, Labs reviewed, EKG interpreted, Old chart reviewed, Radiograph reviewed and Notes from prior ED visits     Kaveri Perras was evaluated in Emergency Department on 05/28/2019 for the symptoms described in the history of present illness. Kari Lowery was evaluated in the context of the global COVID-19 pandemic, which necessitated consideration that the patient might be at risk for infection with the SARS-CoV-2 virus that causes COVID-19. Institutional protocols and algorithms that pertain to the evaluation of patients at risk for COVID-19 are in a state of rapid change based on information released by  regulatory bodies including the CDC and federal and state organizations. These policies and algorithms were followed during the patient's care in the ED.    23 year old female with a history of schizophrenia who presents with right leg and left eye twitching, and stutter.  Differential diagnosis includes but is not limited to [place], CVA, metabolic, medication side effects, toxicological, infectious etiologies, etc.  Will obtain toxicological lab work and urinalysis, CT head to evaluate for intracranial abnormalities.  Initiate IV fluid resuscitation, 1 mg IV Ativan and reassess.  Clinical Course as of May 28 655  Nancy Fetter May 28, 2019  0639 Reassessed patient.  Muscle and eye twitching are resolved and speech is completely clear without stutter.  Kari Lowery is feeling overall improved.  IV fluids infusing.  Awaiting rest of lab results.  Have updated her on negative head scan.   [JS]  [id number] Rest of lab work and urinalysis unremarkable.  Patient will be discharged after completion of IV fluids.   Strict return precautions given.  Patient verbalizes understanding and agrees with plan of care.   [JS]    Clinical Course User Index [JS] Paulette Blanch, MD     ____________________________________________   FINAL CLINICAL IMPRESSION(S) / ED DIAGNOSES  Final diagnoses:  Dystonia  Dehydration     ED Discharge Orders    None       Note:  This document was prepared using Dragon voice recognition software and may include unintentional dictation errors.   Paulette Blanch, MD 06/01/19 [phone]

## 2019-05-28 NOTE — ED Notes (Signed)
Pt ambulatory to toilet to urinate. Pt stating she is ready to go home. IV removed. Pt getting dressed and given phone to call for ride home.

## 2019-05-28 NOTE — ED Notes (Signed)
Pt gave phone to registration and then walked out to lobby prior to this RN obtaining signature for DC.

## 2019-05-28 NOTE — Discharge Instructions (Signed)
Take your medicines as directed by your doctor.  Drink plenty of fluids daily.  Return to the ER for worsening symptoms, persistent vomiting, difficulty breathing or other concerns.
